# Patient Record
Sex: Male | Born: 1945
Health system: Southern US, Community
[De-identification: ages and names within clinical notes are randomized; demographics above are authoritative.]

## PROBLEM LIST (undated history)

## (undated) DIAGNOSIS — G8929 Other chronic pain: Secondary | ICD-10-CM

## (undated) DIAGNOSIS — F419 Anxiety disorder, unspecified: Secondary | ICD-10-CM

## (undated) DIAGNOSIS — C61 Malignant neoplasm of prostate: Secondary | ICD-10-CM

## (undated) DIAGNOSIS — R37 Sexual dysfunction, unspecified: Secondary | ICD-10-CM

## (undated) DIAGNOSIS — M1990 Unspecified osteoarthritis, unspecified site: Secondary | ICD-10-CM

## (undated) DIAGNOSIS — K219 Gastro-esophageal reflux disease without esophagitis: Secondary | ICD-10-CM

## (undated) DIAGNOSIS — I712 Thoracic aortic aneurysm, without rupture, unspecified: Secondary | ICD-10-CM

## (undated) DIAGNOSIS — I255 Ischemic cardiomyopathy: Secondary | ICD-10-CM

## (undated) DIAGNOSIS — J309 Allergic rhinitis, unspecified: Secondary | ICD-10-CM

## (undated) DIAGNOSIS — I2510 Atherosclerotic heart disease of native coronary artery without angina pectoris: Secondary | ICD-10-CM

## (undated) DIAGNOSIS — Z95810 Presence of automatic (implantable) cardiac defibrillator: Secondary | ICD-10-CM

## (undated) HISTORY — DX: Thoracic aortic aneurysm, without rupture, unspecified: I71.20

## (undated) HISTORY — DX: Unspecified osteoarthritis, unspecified site: M19.90

## (undated) HISTORY — DX: Malignant neoplasm of prostate: C61

## (undated) HISTORY — DX: Allergic rhinitis, unspecified: J30.9

## (undated) HISTORY — PX: CATARACT EXTRACTION: SUR2

## (undated) HISTORY — DX: Other chronic pain: G89.29

## (undated) HISTORY — DX: Presence of automatic (implantable) cardiac defibrillator: Z95.810

## (undated) HISTORY — PX: HERNIA REPAIR: SHX51

## (undated) HISTORY — DX: Gastro-esophageal reflux disease without esophagitis: K21.9

## (undated) HISTORY — DX: Sexual dysfunction, unspecified: R37

## (undated) HISTORY — DX: Thoracic aortic aneurysm, without rupture: I71.2

## (undated) HISTORY — DX: Atherosclerotic heart disease of native coronary artery without angina pectoris: I25.10

## (undated) HISTORY — PX: CHOLECYSTECTOMY: SHX55

## (undated) HISTORY — PX: IVC FILTER INSERTION: CATH118245

## (undated) HISTORY — PX: ICD IMPLANT: EP1208

## (undated) HISTORY — DX: Anxiety disorder, unspecified: F41.9

## (undated) HISTORY — DX: Ischemic cardiomyopathy: I25.5

---

## 2000-08-02 HISTORY — PX: CORONARY ARTERY BYPASS GRAFT: SHX141

## 2015-03-10 DIAGNOSIS — I429 Cardiomyopathy, unspecified: Secondary | ICD-10-CM

## 2015-03-10 DIAGNOSIS — I10 Essential (primary) hypertension: Secondary | ICD-10-CM

## 2015-03-10 HISTORY — DX: Essential (primary) hypertension: I10

## 2015-03-10 HISTORY — DX: Cardiomyopathy, unspecified: I42.9

## 2015-06-11 NOTE — Patient Outreach (Signed)
[facility]) Care Management  06/11/2015  DRACEN REIGLE 04-08-1946 [id number]   Referral from ER utilization report, assigned Quinn Plowman, RN for patient outreach.  Emillie Chasen L. Nehal Shives, [place] Care Management Assistant

## 2015-06-30 ENCOUNTER — Other Ambulatory Visit: Payer: Self-pay

## 2015-06-30 NOTE — Patient Outreach (Addendum)
[facility]) Care Management  06/30/2015  James Jenkins September 14, 1945 [id number]  SUBJECTIVE: Telephone call to patient regarding ER high risk referral.  HIPAA verified with patient. Discussed and offered [facility] Care management services to patient. Patient states he is presently receiving services from [facility] at home.  Patient states he has a nurse coming out 1 time per week. Patient states he is doing much better. Patient states he recently received his pacemaker approximately 3-4 months ago.  Patient states he had surgery on his legs.  Patient state now the swelling in his legs is much better and he has been able to walk better than ever.  Patient states he walked 3 miles today.  RNCM offered to send patient [facility] care management outreach letter and brochure for future needs. Patient verbalized agreement.  ASSESSMENT:  ER high risk referral  PLAN; RNCM will refer patient to [place] to close due to patient receiving case management services from another source. RNCM will notify patients primary MD of closure.  RNCM will send patient outreach letter and brochure as discussed.  Quinn Plowman RN,BSN,CCM [place] Coordinator [phone]

## 2017-06-20 DIAGNOSIS — I252 Old myocardial infarction: Secondary | ICD-10-CM

## 2017-06-20 DIAGNOSIS — E785 Hyperlipidemia, unspecified: Secondary | ICD-10-CM

## 2017-06-20 DIAGNOSIS — I82409 Acute embolism and thrombosis of unspecified deep veins of unspecified lower extremity: Secondary | ICD-10-CM

## 2017-06-20 HISTORY — DX: Old myocardial infarction: I25.2

## 2017-06-20 HISTORY — DX: Hyperlipidemia, unspecified: E78.5

## 2017-06-20 HISTORY — DX: Acute embolism and thrombosis of unspecified deep veins of unspecified lower extremity: I82.409

## 2017-08-09 DIAGNOSIS — Z6830 Body mass index (BMI) 30.0-30.9, adult: Secondary | ICD-10-CM | POA: Diagnosis not present

## 2017-08-09 DIAGNOSIS — Z79899 Other long term (current) drug therapy: Secondary | ICD-10-CM | POA: Diagnosis not present

## 2017-08-09 DIAGNOSIS — K219 Gastro-esophageal reflux disease without esophagitis: Secondary | ICD-10-CM | POA: Diagnosis not present

## 2017-08-09 DIAGNOSIS — Z1331 Encounter for screening for depression: Secondary | ICD-10-CM | POA: Diagnosis not present

## 2017-08-09 DIAGNOSIS — E559 Vitamin D deficiency, unspecified: Secondary | ICD-10-CM | POA: Diagnosis not present

## 2017-08-09 DIAGNOSIS — E669 Obesity, unspecified: Secondary | ICD-10-CM | POA: Diagnosis not present

## 2017-08-09 DIAGNOSIS — I2510 Atherosclerotic heart disease of native coronary artery without angina pectoris: Secondary | ICD-10-CM | POA: Diagnosis not present

## 2017-08-09 DIAGNOSIS — Z9181 History of falling: Secondary | ICD-10-CM | POA: Diagnosis not present

## 2017-08-09 DIAGNOSIS — J309 Allergic rhinitis, unspecified: Secondary | ICD-10-CM | POA: Diagnosis not present

## 2017-08-09 DIAGNOSIS — I10 Essential (primary) hypertension: Secondary | ICD-10-CM | POA: Diagnosis not present

## 2017-08-09 DIAGNOSIS — Z95810 Presence of automatic (implantable) cardiac defibrillator: Secondary | ICD-10-CM | POA: Diagnosis not present

## 2017-08-09 DIAGNOSIS — E785 Hyperlipidemia, unspecified: Secondary | ICD-10-CM | POA: Diagnosis not present

## 2017-08-30 DIAGNOSIS — Z6830 Body mass index (BMI) 30.0-30.9, adult: Secondary | ICD-10-CM | POA: Diagnosis not present

## 2017-08-30 DIAGNOSIS — G4762 Sleep related leg cramps: Secondary | ICD-10-CM | POA: Diagnosis not present

## 2017-08-30 DIAGNOSIS — J309 Allergic rhinitis, unspecified: Secondary | ICD-10-CM | POA: Diagnosis not present

## 2017-08-30 DIAGNOSIS — H68003 Unspecified Eustachian salpingitis, bilateral: Secondary | ICD-10-CM | POA: Diagnosis not present

## 2017-09-07 DIAGNOSIS — R7989 Other specified abnormal findings of blood chemistry: Secondary | ICD-10-CM | POA: Diagnosis not present

## 2017-09-07 DIAGNOSIS — R252 Cramp and spasm: Secondary | ICD-10-CM | POA: Diagnosis not present

## 2017-09-07 DIAGNOSIS — I252 Old myocardial infarction: Secondary | ICD-10-CM | POA: Diagnosis not present

## 2017-09-07 DIAGNOSIS — R110 Nausea: Secondary | ICD-10-CM | POA: Diagnosis not present

## 2017-09-07 DIAGNOSIS — I208 Other forms of angina pectoris: Secondary | ICD-10-CM | POA: Diagnosis not present

## 2017-09-07 DIAGNOSIS — I110 Hypertensive heart disease with heart failure: Secondary | ICD-10-CM | POA: Diagnosis not present

## 2017-09-07 DIAGNOSIS — R51 Headache: Secondary | ICD-10-CM | POA: Diagnosis not present

## 2017-09-07 DIAGNOSIS — R531 Weakness: Secondary | ICD-10-CM | POA: Diagnosis not present

## 2017-09-07 DIAGNOSIS — Z95810 Presence of automatic (implantable) cardiac defibrillator: Secondary | ICD-10-CM | POA: Diagnosis not present

## 2017-09-07 DIAGNOSIS — R0602 Shortness of breath: Secondary | ICD-10-CM | POA: Diagnosis not present

## 2017-09-07 DIAGNOSIS — I509 Heart failure, unspecified: Secondary | ICD-10-CM | POA: Diagnosis not present

## 2017-09-07 DIAGNOSIS — R1110 Vomiting, unspecified: Secondary | ICD-10-CM | POA: Diagnosis not present

## 2017-09-07 DIAGNOSIS — I209 Angina pectoris, unspecified: Secondary | ICD-10-CM | POA: Diagnosis not present

## 2017-09-13 DIAGNOSIS — Z79899 Other long term (current) drug therapy: Secondary | ICD-10-CM | POA: Diagnosis not present

## 2017-09-13 DIAGNOSIS — H9313 Tinnitus, bilateral: Secondary | ICD-10-CM | POA: Diagnosis not present

## 2017-09-13 DIAGNOSIS — J309 Allergic rhinitis, unspecified: Secondary | ICD-10-CM | POA: Diagnosis not present

## 2017-09-13 DIAGNOSIS — Z09 Encounter for follow-up examination after completed treatment for conditions other than malignant neoplasm: Secondary | ICD-10-CM | POA: Diagnosis not present

## 2017-09-13 DIAGNOSIS — Z6829 Body mass index (BMI) 29.0-29.9, adult: Secondary | ICD-10-CM | POA: Diagnosis not present

## 2017-09-13 DIAGNOSIS — M436 Torticollis: Secondary | ICD-10-CM | POA: Diagnosis not present

## 2017-10-13 DIAGNOSIS — Z1212 Encounter for screening for malignant neoplasm of rectum: Secondary | ICD-10-CM | POA: Diagnosis not present

## 2017-10-13 DIAGNOSIS — Z1211 Encounter for screening for malignant neoplasm of colon: Secondary | ICD-10-CM | POA: Diagnosis not present

## 2017-11-08 DIAGNOSIS — Z95810 Presence of automatic (implantable) cardiac defibrillator: Secondary | ICD-10-CM | POA: Diagnosis not present

## 2017-12-21 DIAGNOSIS — E785 Hyperlipidemia, unspecified: Secondary | ICD-10-CM | POA: Diagnosis not present

## 2017-12-21 DIAGNOSIS — I429 Cardiomyopathy, unspecified: Secondary | ICD-10-CM | POA: Diagnosis not present

## 2017-12-21 DIAGNOSIS — I2510 Atherosclerotic heart disease of native coronary artery without angina pectoris: Secondary | ICD-10-CM | POA: Diagnosis not present

## 2017-12-21 DIAGNOSIS — I252 Old myocardial infarction: Secondary | ICD-10-CM | POA: Diagnosis not present

## 2017-12-21 DIAGNOSIS — Z95810 Presence of automatic (implantable) cardiac defibrillator: Secondary | ICD-10-CM | POA: Diagnosis not present

## 2018-02-15 DIAGNOSIS — S41101A Unspecified open wound of right upper arm, initial encounter: Secondary | ICD-10-CM | POA: Diagnosis not present

## 2018-02-15 DIAGNOSIS — E663 Overweight: Secondary | ICD-10-CM | POA: Diagnosis not present

## 2018-02-15 DIAGNOSIS — Z6829 Body mass index (BMI) 29.0-29.9, adult: Secondary | ICD-10-CM | POA: Diagnosis not present

## 2018-02-15 DIAGNOSIS — Z1339 Encounter for screening examination for other mental health and behavioral disorders: Secondary | ICD-10-CM | POA: Diagnosis not present

## 2018-02-15 DIAGNOSIS — T148XXA Other injury of unspecified body region, initial encounter: Secondary | ICD-10-CM | POA: Diagnosis not present

## 2018-02-15 DIAGNOSIS — H9313 Tinnitus, bilateral: Secondary | ICD-10-CM | POA: Diagnosis not present

## 2018-02-15 DIAGNOSIS — I2510 Atherosclerotic heart disease of native coronary artery without angina pectoris: Secondary | ICD-10-CM | POA: Diagnosis not present

## 2018-02-15 DIAGNOSIS — E785 Hyperlipidemia, unspecified: Secondary | ICD-10-CM | POA: Diagnosis not present

## 2018-02-15 DIAGNOSIS — I890 Lymphedema, not elsewhere classified: Secondary | ICD-10-CM | POA: Diagnosis not present

## 2018-02-15 DIAGNOSIS — I10 Essential (primary) hypertension: Secondary | ICD-10-CM | POA: Diagnosis not present

## 2018-02-16 DIAGNOSIS — S41101A Unspecified open wound of right upper arm, initial encounter: Secondary | ICD-10-CM | POA: Diagnosis not present

## 2018-02-16 DIAGNOSIS — I2510 Atherosclerotic heart disease of native coronary artery without angina pectoris: Secondary | ICD-10-CM | POA: Diagnosis not present

## 2018-02-16 DIAGNOSIS — I890 Lymphedema, not elsewhere classified: Secondary | ICD-10-CM | POA: Diagnosis not present

## 2018-02-16 DIAGNOSIS — I10 Essential (primary) hypertension: Secondary | ICD-10-CM | POA: Diagnosis not present

## 2018-02-22 DIAGNOSIS — S41101A Unspecified open wound of right upper arm, initial encounter: Secondary | ICD-10-CM | POA: Diagnosis not present

## 2018-02-22 DIAGNOSIS — I2510 Atherosclerotic heart disease of native coronary artery without angina pectoris: Secondary | ICD-10-CM | POA: Diagnosis not present

## 2018-02-22 DIAGNOSIS — I10 Essential (primary) hypertension: Secondary | ICD-10-CM | POA: Diagnosis not present

## 2018-02-22 DIAGNOSIS — I890 Lymphedema, not elsewhere classified: Secondary | ICD-10-CM | POA: Diagnosis not present

## 2018-02-28 DIAGNOSIS — J309 Allergic rhinitis, unspecified: Secondary | ICD-10-CM | POA: Diagnosis not present

## 2018-02-28 DIAGNOSIS — E663 Overweight: Secondary | ICD-10-CM | POA: Diagnosis not present

## 2018-02-28 DIAGNOSIS — T148XXA Other injury of unspecified body region, initial encounter: Secondary | ICD-10-CM | POA: Diagnosis not present

## 2018-02-28 DIAGNOSIS — H68003 Unspecified Eustachian salpingitis, bilateral: Secondary | ICD-10-CM | POA: Diagnosis not present

## 2018-02-28 DIAGNOSIS — Z6829 Body mass index (BMI) 29.0-29.9, adult: Secondary | ICD-10-CM | POA: Diagnosis not present

## 2018-03-01 DIAGNOSIS — S41101A Unspecified open wound of right upper arm, initial encounter: Secondary | ICD-10-CM | POA: Diagnosis not present

## 2018-03-01 DIAGNOSIS — I10 Essential (primary) hypertension: Secondary | ICD-10-CM | POA: Diagnosis not present

## 2018-03-01 DIAGNOSIS — I2510 Atherosclerotic heart disease of native coronary artery without angina pectoris: Secondary | ICD-10-CM | POA: Diagnosis not present

## 2018-03-01 DIAGNOSIS — I890 Lymphedema, not elsewhere classified: Secondary | ICD-10-CM | POA: Diagnosis not present

## 2018-03-08 DIAGNOSIS — I2510 Atherosclerotic heart disease of native coronary artery without angina pectoris: Secondary | ICD-10-CM | POA: Diagnosis not present

## 2018-03-08 DIAGNOSIS — S41101A Unspecified open wound of right upper arm, initial encounter: Secondary | ICD-10-CM | POA: Diagnosis not present

## 2018-03-08 DIAGNOSIS — I890 Lymphedema, not elsewhere classified: Secondary | ICD-10-CM | POA: Diagnosis not present

## 2018-03-08 DIAGNOSIS — I10 Essential (primary) hypertension: Secondary | ICD-10-CM | POA: Diagnosis not present

## 2018-03-20 DIAGNOSIS — Z6828 Body mass index (BMI) 28.0-28.9, adult: Secondary | ICD-10-CM | POA: Diagnosis not present

## 2018-03-20 DIAGNOSIS — Z79899 Other long term (current) drug therapy: Secondary | ICD-10-CM | POA: Diagnosis not present

## 2018-03-20 DIAGNOSIS — B370 Candidal stomatitis: Secondary | ICD-10-CM | POA: Diagnosis not present

## 2018-03-20 DIAGNOSIS — E669 Obesity, unspecified: Secondary | ICD-10-CM | POA: Diagnosis not present

## 2018-03-20 DIAGNOSIS — R413 Other amnesia: Secondary | ICD-10-CM | POA: Diagnosis not present

## 2018-03-20 DIAGNOSIS — I10 Essential (primary) hypertension: Secondary | ICD-10-CM | POA: Diagnosis not present

## 2018-03-20 DIAGNOSIS — T148XXA Other injury of unspecified body region, initial encounter: Secondary | ICD-10-CM | POA: Diagnosis not present

## 2018-03-20 DIAGNOSIS — E663 Overweight: Secondary | ICD-10-CM | POA: Diagnosis not present

## 2018-03-20 DIAGNOSIS — E559 Vitamin D deficiency, unspecified: Secondary | ICD-10-CM | POA: Diagnosis not present

## 2018-03-20 DIAGNOSIS — R739 Hyperglycemia, unspecified: Secondary | ICD-10-CM | POA: Diagnosis not present

## 2018-04-19 DIAGNOSIS — Z95810 Presence of automatic (implantable) cardiac defibrillator: Secondary | ICD-10-CM | POA: Diagnosis not present

## 2018-04-27 DIAGNOSIS — Z23 Encounter for immunization: Secondary | ICD-10-CM | POA: Diagnosis not present

## 2018-05-27 DIAGNOSIS — M5489 Other dorsalgia: Secondary | ICD-10-CM | POA: Diagnosis not present

## 2018-05-27 DIAGNOSIS — N281 Cyst of kidney, acquired: Secondary | ICD-10-CM | POA: Diagnosis not present

## 2018-05-27 DIAGNOSIS — R52 Pain, unspecified: Secondary | ICD-10-CM | POA: Diagnosis not present

## 2018-05-27 DIAGNOSIS — R109 Unspecified abdominal pain: Secondary | ICD-10-CM | POA: Diagnosis not present

## 2018-05-27 DIAGNOSIS — M545 Low back pain: Secondary | ICD-10-CM | POA: Diagnosis not present

## 2018-05-27 DIAGNOSIS — K4090 Unilateral inguinal hernia, without obstruction or gangrene, not specified as recurrent: Secondary | ICD-10-CM | POA: Diagnosis not present

## 2018-06-03 DIAGNOSIS — R319 Hematuria, unspecified: Secondary | ICD-10-CM | POA: Diagnosis not present

## 2018-06-03 DIAGNOSIS — N281 Cyst of kidney, acquired: Secondary | ICD-10-CM | POA: Diagnosis not present

## 2018-06-03 DIAGNOSIS — M545 Low back pain: Secondary | ICD-10-CM | POA: Diagnosis not present

## 2018-06-13 DIAGNOSIS — R310 Gross hematuria: Secondary | ICD-10-CM | POA: Diagnosis not present

## 2018-06-13 DIAGNOSIS — R358 Other polyuria: Secondary | ICD-10-CM | POA: Diagnosis not present

## 2018-06-13 DIAGNOSIS — R339 Retention of urine, unspecified: Secondary | ICD-10-CM | POA: Diagnosis not present

## 2018-06-13 DIAGNOSIS — N138 Other obstructive and reflux uropathy: Secondary | ICD-10-CM | POA: Diagnosis not present

## 2018-06-13 DIAGNOSIS — N401 Enlarged prostate with lower urinary tract symptoms: Secondary | ICD-10-CM | POA: Diagnosis not present

## 2018-06-20 DIAGNOSIS — J209 Acute bronchitis, unspecified: Secondary | ICD-10-CM | POA: Diagnosis not present

## 2018-06-20 DIAGNOSIS — I10 Essential (primary) hypertension: Secondary | ICD-10-CM | POA: Diagnosis not present

## 2018-06-20 DIAGNOSIS — J019 Acute sinusitis, unspecified: Secondary | ICD-10-CM | POA: Diagnosis not present

## 2018-06-20 DIAGNOSIS — Z139 Encounter for screening, unspecified: Secondary | ICD-10-CM | POA: Diagnosis not present

## 2018-06-20 DIAGNOSIS — Z6828 Body mass index (BMI) 28.0-28.9, adult: Secondary | ICD-10-CM | POA: Diagnosis not present

## 2018-06-20 DIAGNOSIS — Z79899 Other long term (current) drug therapy: Secondary | ICD-10-CM | POA: Diagnosis not present

## 2018-06-20 DIAGNOSIS — E785 Hyperlipidemia, unspecified: Secondary | ICD-10-CM | POA: Diagnosis not present

## 2018-06-20 DIAGNOSIS — E559 Vitamin D deficiency, unspecified: Secondary | ICD-10-CM | POA: Diagnosis not present

## 2018-06-22 DIAGNOSIS — N401 Enlarged prostate with lower urinary tract symptoms: Secondary | ICD-10-CM | POA: Diagnosis not present

## 2018-06-22 DIAGNOSIS — N138 Other obstructive and reflux uropathy: Secondary | ICD-10-CM | POA: Diagnosis not present

## 2018-06-26 DIAGNOSIS — N401 Enlarged prostate with lower urinary tract symptoms: Secondary | ICD-10-CM | POA: Diagnosis not present

## 2018-06-26 DIAGNOSIS — E663 Overweight: Secondary | ICD-10-CM | POA: Diagnosis not present

## 2018-06-26 DIAGNOSIS — Z6828 Body mass index (BMI) 28.0-28.9, adult: Secondary | ICD-10-CM | POA: Diagnosis not present

## 2018-06-26 DIAGNOSIS — R413 Other amnesia: Secondary | ICD-10-CM | POA: Diagnosis not present

## 2018-06-26 DIAGNOSIS — J309 Allergic rhinitis, unspecified: Secondary | ICD-10-CM | POA: Diagnosis not present

## 2018-06-26 DIAGNOSIS — R05 Cough: Secondary | ICD-10-CM | POA: Diagnosis not present

## 2018-07-12 DIAGNOSIS — Z139 Encounter for screening, unspecified: Secondary | ICD-10-CM | POA: Diagnosis not present

## 2018-07-12 DIAGNOSIS — Z0000 Encounter for general adult medical examination without abnormal findings: Secondary | ICD-10-CM | POA: Diagnosis not present

## 2018-07-12 DIAGNOSIS — Z125 Encounter for screening for malignant neoplasm of prostate: Secondary | ICD-10-CM | POA: Diagnosis not present

## 2018-07-12 DIAGNOSIS — R413 Other amnesia: Secondary | ICD-10-CM | POA: Diagnosis not present

## 2018-07-12 DIAGNOSIS — Z9181 History of falling: Secondary | ICD-10-CM | POA: Diagnosis not present

## 2018-07-12 DIAGNOSIS — E785 Hyperlipidemia, unspecified: Secondary | ICD-10-CM | POA: Diagnosis not present

## 2018-07-13 DIAGNOSIS — C61 Malignant neoplasm of prostate: Secondary | ICD-10-CM | POA: Diagnosis not present

## 2018-07-13 DIAGNOSIS — R972 Elevated prostate specific antigen [PSA]: Secondary | ICD-10-CM | POA: Diagnosis not present

## 2018-07-14 DIAGNOSIS — I10 Essential (primary) hypertension: Secondary | ICD-10-CM | POA: Diagnosis not present

## 2018-07-14 DIAGNOSIS — Z6828 Body mass index (BMI) 28.0-28.9, adult: Secondary | ICD-10-CM | POA: Diagnosis not present

## 2018-07-15 DIAGNOSIS — R0689 Other abnormalities of breathing: Secondary | ICD-10-CM | POA: Diagnosis not present

## 2018-07-15 DIAGNOSIS — R079 Chest pain, unspecified: Secondary | ICD-10-CM | POA: Insufficient documentation

## 2018-07-15 DIAGNOSIS — I255 Ischemic cardiomyopathy: Secondary | ICD-10-CM | POA: Diagnosis not present

## 2018-07-15 DIAGNOSIS — I25118 Atherosclerotic heart disease of native coronary artery with other forms of angina pectoris: Secondary | ICD-10-CM | POA: Diagnosis not present

## 2018-07-15 DIAGNOSIS — R7989 Other specified abnormal findings of blood chemistry: Secondary | ICD-10-CM | POA: Diagnosis not present

## 2018-07-15 DIAGNOSIS — I420 Dilated cardiomyopathy: Secondary | ICD-10-CM | POA: Diagnosis not present

## 2018-07-15 DIAGNOSIS — Z95810 Presence of automatic (implantable) cardiac defibrillator: Secondary | ICD-10-CM | POA: Diagnosis not present

## 2018-07-15 DIAGNOSIS — I951 Orthostatic hypotension: Secondary | ICD-10-CM | POA: Diagnosis not present

## 2018-07-15 DIAGNOSIS — Z87891 Personal history of nicotine dependence: Secondary | ICD-10-CM | POA: Diagnosis not present

## 2018-07-15 DIAGNOSIS — I2510 Atherosclerotic heart disease of native coronary artery without angina pectoris: Secondary | ICD-10-CM | POA: Diagnosis not present

## 2018-07-15 DIAGNOSIS — R0902 Hypoxemia: Secondary | ICD-10-CM | POA: Diagnosis not present

## 2018-07-15 DIAGNOSIS — Z955 Presence of coronary angioplasty implant and graft: Secondary | ICD-10-CM | POA: Diagnosis not present

## 2018-07-15 DIAGNOSIS — I351 Nonrheumatic aortic (valve) insufficiency: Secondary | ICD-10-CM | POA: Diagnosis not present

## 2018-07-15 DIAGNOSIS — I110 Hypertensive heart disease with heart failure: Secondary | ICD-10-CM | POA: Diagnosis not present

## 2018-07-15 DIAGNOSIS — E7849 Other hyperlipidemia: Secondary | ICD-10-CM | POA: Diagnosis not present

## 2018-07-15 DIAGNOSIS — I499 Cardiac arrhythmia, unspecified: Secondary | ICD-10-CM | POA: Diagnosis not present

## 2018-07-15 DIAGNOSIS — E785 Hyperlipidemia, unspecified: Secondary | ICD-10-CM | POA: Diagnosis not present

## 2018-07-15 DIAGNOSIS — R42 Dizziness and giddiness: Secondary | ICD-10-CM | POA: Diagnosis not present

## 2018-07-15 DIAGNOSIS — R0602 Shortness of breath: Secondary | ICD-10-CM | POA: Diagnosis not present

## 2018-07-15 DIAGNOSIS — I252 Old myocardial infarction: Secondary | ICD-10-CM | POA: Diagnosis not present

## 2018-07-15 DIAGNOSIS — R0789 Other chest pain: Secondary | ICD-10-CM | POA: Diagnosis not present

## 2018-07-15 DIAGNOSIS — R072 Precordial pain: Secondary | ICD-10-CM | POA: Diagnosis not present

## 2018-07-15 DIAGNOSIS — I5020 Unspecified systolic (congestive) heart failure: Secondary | ICD-10-CM | POA: Diagnosis not present

## 2018-07-15 HISTORY — DX: Chest pain, unspecified: R07.9

## 2018-07-16 DIAGNOSIS — Z955 Presence of coronary angioplasty implant and graft: Secondary | ICD-10-CM | POA: Diagnosis not present

## 2018-07-16 DIAGNOSIS — R079 Chest pain, unspecified: Secondary | ICD-10-CM | POA: Diagnosis not present

## 2018-07-16 DIAGNOSIS — I255 Ischemic cardiomyopathy: Secondary | ICD-10-CM | POA: Diagnosis not present

## 2018-07-16 DIAGNOSIS — I2510 Atherosclerotic heart disease of native coronary artery without angina pectoris: Secondary | ICD-10-CM | POA: Diagnosis not present

## 2018-07-16 DIAGNOSIS — E7849 Other hyperlipidemia: Secondary | ICD-10-CM | POA: Diagnosis not present

## 2018-07-16 DIAGNOSIS — I252 Old myocardial infarction: Secondary | ICD-10-CM | POA: Diagnosis not present

## 2018-07-16 DIAGNOSIS — E785 Hyperlipidemia, unspecified: Secondary | ICD-10-CM | POA: Diagnosis not present

## 2018-07-16 DIAGNOSIS — Z95810 Presence of automatic (implantable) cardiac defibrillator: Secondary | ICD-10-CM | POA: Diagnosis not present

## 2018-07-16 DIAGNOSIS — I951 Orthostatic hypotension: Secondary | ICD-10-CM | POA: Diagnosis not present

## 2018-07-16 DIAGNOSIS — R0789 Other chest pain: Secondary | ICD-10-CM | POA: Diagnosis not present

## 2018-07-16 DIAGNOSIS — R9431 Abnormal electrocardiogram [ECG] [EKG]: Secondary | ICD-10-CM | POA: Diagnosis not present

## 2018-07-16 DIAGNOSIS — I420 Dilated cardiomyopathy: Secondary | ICD-10-CM | POA: Diagnosis not present

## 2018-07-17 DIAGNOSIS — Z09 Encounter for follow-up examination after completed treatment for conditions other than malignant neoplasm: Secondary | ICD-10-CM | POA: Diagnosis not present

## 2018-07-17 DIAGNOSIS — Z6828 Body mass index (BMI) 28.0-28.9, adult: Secondary | ICD-10-CM | POA: Diagnosis not present

## 2018-07-17 DIAGNOSIS — E559 Vitamin D deficiency, unspecified: Secondary | ICD-10-CM | POA: Diagnosis not present

## 2018-07-17 DIAGNOSIS — I2510 Atherosclerotic heart disease of native coronary artery without angina pectoris: Secondary | ICD-10-CM | POA: Diagnosis not present

## 2018-07-17 DIAGNOSIS — E785 Hyperlipidemia, unspecified: Secondary | ICD-10-CM | POA: Diagnosis not present

## 2018-07-17 DIAGNOSIS — Z79899 Other long term (current) drug therapy: Secondary | ICD-10-CM | POA: Diagnosis not present

## 2018-07-17 DIAGNOSIS — I10 Essential (primary) hypertension: Secondary | ICD-10-CM | POA: Diagnosis not present

## 2018-07-19 DIAGNOSIS — Z95810 Presence of automatic (implantable) cardiac defibrillator: Secondary | ICD-10-CM | POA: Diagnosis not present

## 2018-07-20 DIAGNOSIS — C61 Malignant neoplasm of prostate: Secondary | ICD-10-CM | POA: Diagnosis not present

## 2018-07-24 DIAGNOSIS — I429 Cardiomyopathy, unspecified: Secondary | ICD-10-CM | POA: Diagnosis not present

## 2018-07-24 DIAGNOSIS — Z95810 Presence of automatic (implantable) cardiac defibrillator: Secondary | ICD-10-CM | POA: Diagnosis not present

## 2018-08-10 DIAGNOSIS — N400 Enlarged prostate without lower urinary tract symptoms: Secondary | ICD-10-CM | POA: Diagnosis not present

## 2018-08-10 DIAGNOSIS — K769 Liver disease, unspecified: Secondary | ICD-10-CM | POA: Diagnosis not present

## 2018-08-10 DIAGNOSIS — C61 Malignant neoplasm of prostate: Secondary | ICD-10-CM | POA: Diagnosis not present

## 2018-08-17 DIAGNOSIS — E785 Hyperlipidemia, unspecified: Secondary | ICD-10-CM | POA: Diagnosis not present

## 2018-08-17 DIAGNOSIS — Z79899 Other long term (current) drug therapy: Secondary | ICD-10-CM | POA: Diagnosis not present

## 2018-08-17 DIAGNOSIS — I2510 Atherosclerotic heart disease of native coronary artery without angina pectoris: Secondary | ICD-10-CM | POA: Diagnosis not present

## 2018-08-17 DIAGNOSIS — J309 Allergic rhinitis, unspecified: Secondary | ICD-10-CM | POA: Diagnosis not present

## 2018-08-17 DIAGNOSIS — E559 Vitamin D deficiency, unspecified: Secondary | ICD-10-CM | POA: Diagnosis not present

## 2018-08-17 DIAGNOSIS — Z6828 Body mass index (BMI) 28.0-28.9, adult: Secondary | ICD-10-CM | POA: Diagnosis not present

## 2018-08-17 DIAGNOSIS — C61 Malignant neoplasm of prostate: Secondary | ICD-10-CM | POA: Diagnosis not present

## 2018-08-17 DIAGNOSIS — I10 Essential (primary) hypertension: Secondary | ICD-10-CM | POA: Diagnosis not present

## 2018-08-17 DIAGNOSIS — J209 Acute bronchitis, unspecified: Secondary | ICD-10-CM | POA: Diagnosis not present

## 2018-08-29 DIAGNOSIS — Z6828 Body mass index (BMI) 28.0-28.9, adult: Secondary | ICD-10-CM | POA: Diagnosis not present

## 2018-08-29 DIAGNOSIS — J309 Allergic rhinitis, unspecified: Secondary | ICD-10-CM | POA: Diagnosis not present

## 2018-08-29 DIAGNOSIS — C61 Malignant neoplasm of prostate: Secondary | ICD-10-CM | POA: Diagnosis not present

## 2018-09-01 DIAGNOSIS — C61 Malignant neoplasm of prostate: Secondary | ICD-10-CM | POA: Diagnosis not present

## 2018-09-06 ENCOUNTER — Encounter: Payer: Self-pay | Admitting: Cardiology

## 2018-09-06 ENCOUNTER — Telehealth: Payer: Self-pay | Admitting: *Deleted

## 2018-09-06 ENCOUNTER — Ambulatory Visit (INDEPENDENT_AMBULATORY_CARE_PROVIDER_SITE_OTHER): Payer: Medicare HMO | Admitting: Cardiology

## 2018-09-06 DIAGNOSIS — Z95810 Presence of automatic (implantable) cardiac defibrillator: Secondary | ICD-10-CM | POA: Insufficient documentation

## 2018-09-06 DIAGNOSIS — I255 Ischemic cardiomyopathy: Secondary | ICD-10-CM | POA: Diagnosis not present

## 2018-09-06 DIAGNOSIS — I2510 Atherosclerotic heart disease of native coronary artery without angina pectoris: Secondary | ICD-10-CM

## 2018-09-06 DIAGNOSIS — E785 Hyperlipidemia, unspecified: Secondary | ICD-10-CM

## 2018-09-06 DIAGNOSIS — I5022 Chronic systolic (congestive) heart failure: Secondary | ICD-10-CM | POA: Insufficient documentation

## 2018-09-06 HISTORY — DX: Hyperlipidemia, unspecified: E78.5

## 2018-09-06 HISTORY — DX: Presence of automatic (implantable) cardiac defibrillator: Z95.810

## 2018-09-06 MED ORDER — NITROGLYCERIN 0.4 MG SL SUBL: 0.4000 mg | SUBLINGUAL_TABLET | SUBLINGUAL | 1 refills | Status: AC | PRN

## 2018-09-06 NOTE — Progress Notes (Signed)
Cardiology Office Note:    Date:  09/06/2018   ID:  James Jenkins, DOB 1946-01-17, MRN [id number]  PCP:  Nicholos Johns, MD  Cardiologist:  Jenne Campus, MD    Referring MD: Nicholos Johns, MD   Chief Complaint  Patient presents with  . Coronary Artery Disease  Doing well  History of Present Illness:    James Jenkins is a 73 y.o. male is a patient of mine that I took care of previously while in hospital.  He is being followed by different group in the meantime and now he is trying to establish back as a patient in my practice.  Overall he seems to be doing well he was in the hospital a few months ago because of atypical chest pain.  His biochemical markers was unrevealing and since that time he is doing well he said he is very active he walks a lot have no difficulty doing it.  Denies have any chest pain tightness squeezing pressure burning chest.  Denies having any recent discharges from the defibrillator.  He does have some memory problem he cannot remember numbers he cannot remember his phone number.  He is taking some Aricept for it.  He cannot remember medication he is taking he forgot to take the list of his medication today with him.  No past medical history on file.    Current Medications: Current Meds  Medication Sig  . aspirin 81 MG chewable tablet Chew 1 tablet by mouth daily.  Marland Kitchen donepezil (ARICEPT) 10 MG tablet Take 10 mg by mouth daily.  . enalapril (VASOTEC) 5 MG tablet Take 1 tablet by mouth daily.  . fluticasone (FLONASE) 50 MCG/ACT nasal spray Place 2 sprays into both nostrils daily.  . furosemide (LASIX) 20 MG tablet Take 20 mg by mouth daily.  . isosorbide mononitrate (IMDUR) 30 MG 24 hr tablet Take 1 tablet by mouth daily.  . metoprolol succinate (TOPROL-XL) 25 MG 24 hr tablet Take 1 tablet by mouth daily.  . nitroGLYCERIN (NITROSTAT) 0.4 MG SL tablet Place 1 tablet under the tongue as needed for chest pain.  . Omega-3 1000 MG CAPS Take 2 g by mouth daily.  .  pantoprazole (PROTONIX) 40 MG tablet Take 1 tablet by mouth daily.  . sacubitril-valsartan (ENTRESTO) 49-51 MG Take 1 tablet by mouth 2 (two) times daily.  . simvastatin (ZOCOR) 40 MG tablet Take 1 tablet by mouth daily.  . tamsulosin (FLOMAX) 0.4 MG CAPS capsule Take 1 capsule by mouth daily.     Allergies:   Bee venom   Social History   Socioeconomic History  . Marital status: Married    Spouse name: Not on file  . Number of children: Not on file  . Years of education: Not on file  . Highest education level: Not on file  Occupational History  . Not on file  Social Needs  . Financial resource strain: Not on file  . Food insecurity:    Worry: Not on file    Inability: Not on file  . Transportation needs:    Medical: Not on file    Non-medical: Not on file  Tobacco Use  . Smoking status: Former Smoker    Types: Cigarettes  . Smokeless tobacco: Never Used  Substance and Sexual Activity  . Alcohol use: Never    Frequency: Never  . Drug use: Never  . Sexual activity: Not on file  Lifestyle  . Physical activity:    Days per week: Not on  file    Minutes per session: Not on file  . Stress: Not on file  Relationships  . Social connections:    Talks on phone: Not on file    Gets together: Not on file    Attends religious service: Not on file    Active member of club or organization: Not on file    Attends meetings of clubs or organizations: Not on file    Relationship status: Not on file  Other Topics Concern  . Not on file  Social History Narrative  . Not on file     Family History: The patient's family history includes Cancer in his brother; Diabetes in his sister; Heart attack in his sister; Heart disease in his brother and father. ROS:   Please see the history of present illness.    All 14 point review of systems negative except as described per history of present illness  EKGs/Labs/Other Studies Reviewed:      Recent Labs: No results found for requested  labs within last 8760 hours.  Recent Lipid Panel No results found for: CHOL, TRIG, HDL, CHOLHDL, VLDL, LDLCALC, LDLDIRECT  Physical Exam:    VS:  BP 124/64   Pulse 62   Ht 5\' 6"  (1.676 m)   Wt 175 lb 3.2 oz (79.5 kg)   SpO2 94%   BMI 28.28 kg/m     Wt Readings from Last 3 Encounters:  09/06/18 175 lb 3.2 oz (79.5 kg)     GEN:  Well nourished, well developed in no acute distress HEENT: Normal NECK: No JVD; No carotid bruits LYMPHATICS: No lymphadenopathy CARDIAC: RRR, no murmurs, no rubs, no gallops RESPIRATORY:  Clear to auscultation without rales, wheezing or rhonchi  ABDOMEN: Soft, non-tender, non-distended MUSCULOSKELETAL:  No edema; No deformity  SKIN: Warm and dry LOWER EXTREMITIES: no swelling NEUROLOGIC:  Alert and oriented x 3 PSYCHIATRIC:  Normal affect   ASSESSMENT:    1. Ischemic cardiomyopathy   2. Coronary artery disease involving native coronary artery of native heart without angina pectoris   3. Dyslipidemia   4. Chronic systolic (congestive) heart failure (HCC)   5. Presence of single chamber implantable cardioverter-defibrillator (ICD) Medtronic device    PLAN:    In order of problems listed above:  1. Ischemic cardiomyopathy last estimation in December echocardiogram showing 35% again I am unclear about medication he is taking on the list to have Entresto as well as Vasotec which is impossible.  Again he will call us later today or he will bring a list of medication to [place] so we can verify what medication he has been taking. 2. Coronary artery disease no recent problems on appropriate medications which I will continue. 3. Dyslipidemia I have his LDL from December 16 which is 48 which is obviously too high and unacceptably high for somebody with his problem will get list of the medication and his therapy will be augmented. 4. ICD present this is a Medtronic device I will schedule him to have appointment with our EP team so we will be able to establish him  in the clinic.  Overall he is doing well continue present management see him back in 2 months and he will call us later today with a list of medications   Medication Adjustments/Labs and Tests Ordered: Current medicines are reviewed at length with the patient today.  Concerns regarding medicines are outlined above.  No orders of the defined types were placed in this encounter.  Medication changes: No orders of  the defined types were placed in this encounter.   Signed, Park Liter, MD, [facility] 09/06/2018 9:59 AM    [place]

## 2018-09-06 NOTE — Telephone Encounter (Signed)
*  STAT* If patient is at the pharmacy, call can be transferred to refill team.   1. Which medications need to be refilled? (please list name of each medication and dose if known) Nitroglycerine  2. Which pharmacy/location (including street and city if local pharmacy) is medication to be sent to?[facility] in [place]  3. Do they need a 30 day or 90 day supply?

## 2018-09-06 NOTE — Patient Instructions (Signed)
Medication Instructions:  Your physician recommends that you continue on your current medications as directed. Please refer to the Current Medication list given to you today.  If you need a refill on your cardiac medications before your next appointment, please call your pharmacy.   Lab work: None.  If you have labs (blood work) drawn today and your tests are completely normal, you will receive your results only by: Marland Kitchen MyChart Message (if you have MyChart) OR . A paper copy in the mail If you have any lab test that is abnormal or we need to change your treatment, we will call you to review the results.  Testing/Procedures: None.  Follow-Up: At [facility], you and your health needs are our priority.  As part of our continuing mission to provide you with exceptional heart care, we have created designated Provider Care Teams.  These Care Teams include your primary Cardiologist (physician) and Advanced Practice Providers (APPs -  Physician Assistants and Nurse Practitioners) who all work together to provide you with the care you need, when you need it. You will need a follow up appointment in 2 months.  Please call our office 2 months in advance to schedule this appointment.  You may see No primary care provider on file. or another member of our [employer] Provider Team in [place]: Shirlee More, MD . Jyl Heinz, MD  Any Other Special Instructions Will Be Listed Below (If Applicable).   Dr. Agustin Cree has referred you to see Dr. Curt Bears for management of your icd.

## 2018-09-06 NOTE — Addendum Note (Signed)
Addended by: Ashok Norris on: 09/06/2018 10:05 AM   Modules accepted: Orders

## 2018-09-07 DIAGNOSIS — E785 Hyperlipidemia, unspecified: Secondary | ICD-10-CM | POA: Diagnosis not present

## 2018-09-07 DIAGNOSIS — R7303 Prediabetes: Secondary | ICD-10-CM | POA: Diagnosis not present

## 2018-09-07 DIAGNOSIS — M545 Low back pain: Secondary | ICD-10-CM | POA: Diagnosis not present

## 2018-09-07 DIAGNOSIS — I420 Dilated cardiomyopathy: Secondary | ICD-10-CM | POA: Diagnosis not present

## 2018-09-07 DIAGNOSIS — J309 Allergic rhinitis, unspecified: Secondary | ICD-10-CM | POA: Diagnosis not present

## 2018-09-07 DIAGNOSIS — I10 Essential (primary) hypertension: Secondary | ICD-10-CM | POA: Diagnosis not present

## 2018-09-07 DIAGNOSIS — R413 Other amnesia: Secondary | ICD-10-CM | POA: Diagnosis not present

## 2018-09-07 DIAGNOSIS — Z79899 Other long term (current) drug therapy: Secondary | ICD-10-CM | POA: Diagnosis not present

## 2018-09-07 DIAGNOSIS — I2510 Atherosclerotic heart disease of native coronary artery without angina pectoris: Secondary | ICD-10-CM | POA: Diagnosis not present

## 2018-09-07 DIAGNOSIS — C61 Malignant neoplasm of prostate: Secondary | ICD-10-CM | POA: Diagnosis not present

## 2018-09-07 DIAGNOSIS — K219 Gastro-esophageal reflux disease without esophagitis: Secondary | ICD-10-CM | POA: Diagnosis not present

## 2018-09-07 DIAGNOSIS — I252 Old myocardial infarction: Secondary | ICD-10-CM | POA: Diagnosis not present

## 2018-09-07 DIAGNOSIS — Z6828 Body mass index (BMI) 28.0-28.9, adult: Secondary | ICD-10-CM | POA: Diagnosis not present

## 2018-09-11 DIAGNOSIS — C61 Malignant neoplasm of prostate: Secondary | ICD-10-CM | POA: Diagnosis not present

## 2018-09-13 DIAGNOSIS — J410 Simple chronic bronchitis: Secondary | ICD-10-CM | POA: Diagnosis not present

## 2018-09-14 DIAGNOSIS — J410 Simple chronic bronchitis: Secondary | ICD-10-CM | POA: Diagnosis not present

## 2018-09-15 DIAGNOSIS — Z8679 Personal history of other diseases of the circulatory system: Secondary | ICD-10-CM | POA: Diagnosis not present

## 2018-09-15 DIAGNOSIS — R05 Cough: Secondary | ICD-10-CM | POA: Diagnosis not present

## 2018-09-15 DIAGNOSIS — R072 Precordial pain: Secondary | ICD-10-CM | POA: Diagnosis not present

## 2018-09-15 DIAGNOSIS — Z8546 Personal history of malignant neoplasm of prostate: Secondary | ICD-10-CM | POA: Diagnosis not present

## 2018-09-15 DIAGNOSIS — R062 Wheezing: Secondary | ICD-10-CM | POA: Diagnosis not present

## 2018-09-15 DIAGNOSIS — C61 Malignant neoplasm of prostate: Secondary | ICD-10-CM | POA: Diagnosis not present

## 2018-09-15 DIAGNOSIS — R079 Chest pain, unspecified: Secondary | ICD-10-CM | POA: Diagnosis not present

## 2018-09-15 DIAGNOSIS — J441 Chronic obstructive pulmonary disease with (acute) exacerbation: Secondary | ICD-10-CM | POA: Diagnosis not present

## 2018-09-15 DIAGNOSIS — R450 Nervousness: Secondary | ICD-10-CM | POA: Diagnosis not present

## 2018-09-18 DIAGNOSIS — I4949 Other premature depolarization: Secondary | ICD-10-CM | POA: Diagnosis not present

## 2018-09-18 DIAGNOSIS — I4589 Other specified conduction disorders: Secondary | ICD-10-CM | POA: Diagnosis not present

## 2018-09-18 DIAGNOSIS — I2109 ST elevation (STEMI) myocardial infarction involving other coronary artery of anterior wall: Secondary | ICD-10-CM | POA: Diagnosis not present

## 2018-09-18 DIAGNOSIS — R079 Chest pain, unspecified: Secondary | ICD-10-CM | POA: Diagnosis not present

## 2018-09-18 DIAGNOSIS — I2119 ST elevation (STEMI) myocardial infarction involving other coronary artery of inferior wall: Secondary | ICD-10-CM | POA: Diagnosis not present

## 2018-10-12 DIAGNOSIS — J410 Simple chronic bronchitis: Secondary | ICD-10-CM | POA: Diagnosis not present

## 2018-10-18 DIAGNOSIS — Z95810 Presence of automatic (implantable) cardiac defibrillator: Secondary | ICD-10-CM | POA: Diagnosis not present

## 2018-10-23 DIAGNOSIS — Z510 Encounter for antineoplastic radiation therapy: Secondary | ICD-10-CM | POA: Diagnosis not present

## 2018-10-23 DIAGNOSIS — C61 Malignant neoplasm of prostate: Secondary | ICD-10-CM | POA: Diagnosis not present

## 2018-10-25 DIAGNOSIS — C61 Malignant neoplasm of prostate: Secondary | ICD-10-CM | POA: Diagnosis not present

## 2018-10-26 DIAGNOSIS — Z510 Encounter for antineoplastic radiation therapy: Secondary | ICD-10-CM | POA: Diagnosis not present

## 2018-10-26 DIAGNOSIS — C61 Malignant neoplasm of prostate: Secondary | ICD-10-CM | POA: Diagnosis not present

## 2018-10-30 DIAGNOSIS — C61 Malignant neoplasm of prostate: Secondary | ICD-10-CM | POA: Diagnosis not present

## 2018-10-30 DIAGNOSIS — Z510 Encounter for antineoplastic radiation therapy: Secondary | ICD-10-CM | POA: Diagnosis not present

## 2018-10-31 DIAGNOSIS — C61 Malignant neoplasm of prostate: Secondary | ICD-10-CM | POA: Diagnosis not present

## 2018-10-31 DIAGNOSIS — Z510 Encounter for antineoplastic radiation therapy: Secondary | ICD-10-CM | POA: Diagnosis not present

## 2018-11-01 DIAGNOSIS — Z510 Encounter for antineoplastic radiation therapy: Secondary | ICD-10-CM | POA: Diagnosis not present

## 2018-11-01 DIAGNOSIS — C61 Malignant neoplasm of prostate: Secondary | ICD-10-CM | POA: Diagnosis not present

## 2018-11-02 ENCOUNTER — Telehealth: Payer: Self-pay | Admitting: *Deleted

## 2018-11-02 DIAGNOSIS — Z510 Encounter for antineoplastic radiation therapy: Secondary | ICD-10-CM | POA: Diagnosis not present

## 2018-11-02 DIAGNOSIS — C61 Malignant neoplasm of prostate: Secondary | ICD-10-CM | POA: Diagnosis not present

## 2018-11-02 NOTE — Telephone Encounter (Signed)
Returned patient's call.  Phone rings, goes silent , then hangs up. Will try again a little later.

## 2018-11-02 NOTE — Telephone Encounter (Signed)
Called patient to let them know due to recent [employer] and Health Department Protocols, we are not seeing patients in the office. We are instead seeing if they would like to schedule this appointment as a Research scientist (medical) or Laptop.  Patient is having difficulty hearing on the phone.  Will call back to see if calling again will help.

## 2018-11-02 NOTE — Telephone Encounter (Signed)
Please call back to schedule patient

## 2018-11-03 DIAGNOSIS — Z510 Encounter for antineoplastic radiation therapy: Secondary | ICD-10-CM | POA: Diagnosis not present

## 2018-11-03 DIAGNOSIS — C61 Malignant neoplasm of prostate: Secondary | ICD-10-CM | POA: Diagnosis not present

## 2018-11-06 DIAGNOSIS — C61 Malignant neoplasm of prostate: Secondary | ICD-10-CM | POA: Diagnosis not present

## 2018-11-06 DIAGNOSIS — Z510 Encounter for antineoplastic radiation therapy: Secondary | ICD-10-CM | POA: Diagnosis not present

## 2018-11-07 DIAGNOSIS — C61 Malignant neoplasm of prostate: Secondary | ICD-10-CM | POA: Diagnosis not present

## 2018-11-07 DIAGNOSIS — Z510 Encounter for antineoplastic radiation therapy: Secondary | ICD-10-CM | POA: Diagnosis not present

## 2018-11-07 NOTE — Telephone Encounter (Signed)
Pacemaker Data that I found though Care EveryWhere: Medtronic EVERA ICD [place], [place] [id number]  Patient does not know where his card is with his device information. He is very hard of hearing on the phone but understands with slow loud voices. He does not know any of his device transmission information and says it is not on there and he does not have it.

## 2018-11-07 NOTE — Telephone Encounter (Signed)
[place] and requested that pt be released. Operator took message and sent it the DC.

## 2018-11-08 DIAGNOSIS — Z510 Encounter for antineoplastic radiation therapy: Secondary | ICD-10-CM | POA: Diagnosis not present

## 2018-11-08 DIAGNOSIS — C61 Malignant neoplasm of prostate: Secondary | ICD-10-CM | POA: Diagnosis not present

## 2018-11-08 NOTE — Telephone Encounter (Signed)
Transfer complete. Patient will need to send manual transmission for 4/13 tele visit w/ MD.

## 2018-11-09 DIAGNOSIS — C61 Malignant neoplasm of prostate: Secondary | ICD-10-CM | POA: Diagnosis not present

## 2018-11-09 DIAGNOSIS — Z510 Encounter for antineoplastic radiation therapy: Secondary | ICD-10-CM | POA: Diagnosis not present

## 2018-11-10 ENCOUNTER — Telehealth: Payer: Self-pay | Admitting: Cardiology

## 2018-11-10 NOTE — Telephone Encounter (Signed)
Attempted to call pt x2 to request manual transmission. No answer and unable to leave a message. Last carelink transmission 10/16/2018.

## 2018-11-12 DIAGNOSIS — J410 Simple chronic bronchitis: Secondary | ICD-10-CM | POA: Diagnosis not present

## 2018-11-13 ENCOUNTER — Encounter: Payer: Self-pay | Admitting: Cardiology

## 2018-11-13 ENCOUNTER — Other Ambulatory Visit: Payer: Self-pay

## 2018-11-13 ENCOUNTER — Telehealth (INDEPENDENT_AMBULATORY_CARE_PROVIDER_SITE_OTHER): Payer: Medicare HMO | Admitting: Cardiology

## 2018-11-13 ENCOUNTER — Telehealth: Payer: Self-pay | Admitting: *Deleted

## 2018-11-13 DIAGNOSIS — I5022 Chronic systolic (congestive) heart failure: Secondary | ICD-10-CM

## 2018-11-13 DIAGNOSIS — C61 Malignant neoplasm of prostate: Secondary | ICD-10-CM | POA: Diagnosis not present

## 2018-11-13 DIAGNOSIS — Z510 Encounter for antineoplastic radiation therapy: Secondary | ICD-10-CM | POA: Diagnosis not present

## 2018-11-13 NOTE — Telephone Encounter (Signed)
Called pt and a family member answered she gave me another number to call the patient on [phone]. Attempted to call pt on that number no answer and unable to leave a message.

## 2018-11-13 NOTE — Progress Notes (Signed)
Electrophysiology TeleHealth Note   Due to national recommendations of social distancing due to COVID 19, an audio/video telehealth visit is felt to be most appropriate for this patient at this time.  Verbal consent for this visit documented in epic  Date:  11/13/2018   ID:  James Jenkins, DOB 1945-10-20, MRN [id number]  Location: patient's home  Provider location: [address] [place]  Evaluation Performed: Follow-up visit  PCP:  Nicholos Johns, MD  Cardiologist: Agustin Cree Electrophysiologist:  Dr Curt Bears  Chief Complaint: CHF  History of Present Illness:    James Jenkins is a 73 y.o. male who presents via audio/video conferencing for a telehealth visit today.  Since last being seen in our clinic, the patient reports doing very well.  Today, he denies symptoms of palpitations, chest pain, shortness of breath,  lower extremity edema, dizziness, presyncope, or syncope.  The patient is otherwise without complaint today.  The patient denies symptoms of fevers, chills, cough, or new SOB worrisome for COVID 19.  He has a history of ischemic cardiomyopathy, coronary artery disease, hyperlipidemia, and chronic systolic heart failure.  He has a single-chamber Medtronic ICD.  Today, denies symptoms of palpitations, chest pain, shortness of breath, orthopnea, PND, lower extremity edema, claudication, dizziness, presyncope, syncope, bleeding, or neurologic sequela. The patient is tolerating medications without difficulties.  He currently feels well without major complaints.  He is having no chest pain or shortness of breath.  He is able to do all of his daily activities without restriction.  Past Medical History:  Diagnosis Date  . Coronary artery disease   . ICD (implantable cardioverter-defibrillator) in place    primary prevention, medtronic  . Ischemic cardiomyopathy     Past Surgical History:  Procedure Laterality Date  . CHOLECYSTECTOMY    . HERNIA REPAIR    .  ICD IMPLANT     MDT    Current Outpatient Medications  Medication Sig Dispense Refill  . aspirin 81 MG chewable tablet Chew 1 tablet by mouth daily.    Marland Kitchen donepezil (ARICEPT) 10 MG tablet Take 10 mg by mouth daily.    . enalapril (VASOTEC) 5 MG tablet Take 1 tablet by mouth daily.    . fluticasone (FLONASE) 50 MCG/ACT nasal spray Place 2 sprays into both nostrils daily.    . furosemide (LASIX) 20 MG tablet Take 20 mg by mouth daily.    . isosorbide mononitrate (IMDUR) 30 MG 24 hr tablet Take 1 tablet by mouth daily.    . metoprolol succinate (TOPROL-XL) 25 MG 24 hr tablet Take 1 tablet by mouth daily.    . nitroGLYCERIN (NITROSTAT) 0.4 MG SL tablet Place 1 tablet (0.4 mg total) under the tongue as needed for chest pain. 25 tablet 1  . Omega-3 1000 MG CAPS Take 2 g by mouth daily.    . pantoprazole (PROTONIX) 40 MG tablet Take 1 tablet by mouth daily.    . sacubitril-valsartan (ENTRESTO) 49-51 MG Take 1 tablet by mouth 2 (two) times daily.    . simvastatin (ZOCOR) 40 MG tablet Take 1 tablet by mouth daily.    . tamsulosin (FLOMAX) 0.4 MG CAPS capsule Take 1 capsule by mouth daily.     No current facility-administered medications for this visit.     Allergies:   Bee venom   Social History:  The patient  reports that he has quit smoking. His smoking use included cigarettes. He has never used smokeless tobacco. He reports that he  does not drink alcohol or use drugs.   Family History:  The patient's  family history includes Cancer in his brother; Diabetes in his sister; Heart attack in his sister; Heart disease in his brother and father.   ROS:  Please see the history of present illness.   All other systems are personally reviewed and negative.    Exam:    Vital Signs:  Pulse 62   Wt 160 lb (72.6 kg)   BMI 25.82 kg/m   Over the phone, no acute distress, no shortness of breath   Labs/Other Tests and Data Reviewed:    Recent Labs: No results found for requested labs within last  8760 hours.   Wt Readings from Last 3 Encounters:  11/13/18 160 lb (72.6 kg)  09/06/18 175 lb 3.2 oz (79.5 kg)     Other studies personally reviewed: Additional studies/ records that were reviewed today include: TTE 07/15/2018 Summary Severely reduced LV systolic function. Ejection fraction is visually estimated at 35% There is global hypokinesis with focal hypokinesis in the anterior wall an apex. Diastolic function appears abnormal with no evidence of elevated LVEDP. Normal right ventricular size and systolic function. Mild aortic regurgitation.    ASSESSMENT & PLAN:    1.  Chronic systolic heart failure due to ischemic cardiomyopathy: Currently with a Medtronic single-chamber ICD.  Currently on optimal medical therapy with Entresto, Toprol-XL.  We will plan to set him up with remote monitoring through our clinic.  2.  Coronary artery disease: No current chest pain.  We will continue with current management.   COVID 19 screen The patient denies symptoms of COVID 19 at this time.  The importance of social distancing was discussed today.  Follow-up: 1 year  Current medicines are reviewed at length with the patient today.   The patient does not have concerns regarding his medicines.  The following changes were made today:  none  Labs/ tests ordered today include:  No orders of the defined types were placed in this encounter.    Patient Risk:  after full review of this patients clinical status, I feel that they are at moderate risk at this time.  Today, I have spent 15 minutes with the patient with telehealth technology discussing CHF.    Signed, Will Meredith Leeds, MD  11/13/2018 3:37 PM     [place] [address] [phone] (office) [phone] (fax)

## 2018-11-13 NOTE — Telephone Encounter (Signed)
Virtual Visit Pre-Appointment Phone Call  Steps For Call:  1. Confirm consent - "In the setting of the current Covid19 crisis, you are scheduled for a (phone or video) visit with your provider on (date) at (time).  Just as we do with many in-office visits, in order for you to participate in this visit, we must obtain consent.  If you'd like, I can send this to your mychart (if signed up) or email for you to review.  Otherwise, I can obtain your verbal consent now.  All virtual visits are billed to your insurance company just like a normal visit would be.  By agreeing to a virtual visit, we'd like you to understand that the technology does not allow for your provider to perform an examination, and thus may limit your provider's ability to fully assess your condition.  Finally, though the technology is pretty good, we cannot assure that it will always work on either your or our end, and in the setting of a video visit, we may have to convert it to a phone-only visit.  In either situation, we cannot ensure that we have a secure connection.  Are you willing to proceed?"  2. Give patient instructions for WebEx download to smartphone as below if video visit  3. Advise patient to be prepared with any vital sign or heart rhythm information, their current medicines, and a piece of paper and pen handy for any instructions they may receive the day of their visit  4. Inform patient they will receive a phone call 15 minutes prior to their appointment time (may be from unknown caller ID) so they should be prepared to answer  5. Confirm that appointment type is correct in Epic appointment notes (video vs telephone)    TELEPHONE CALL NOTE  James Jenkins has been deemed a candidate for a follow-up tele-health visit to limit community exposure during the Covid-19 pandemic. I spoke with the patient via phone to ensure availability of phone/video source, confirm preferred email & phone number, and discuss  instructions and expectations.  I reminded James Jenkins to be prepared with any vital sign and/or heart rhythm information that could potentially be obtained via home monitoring, at the time of his visit. I reminded James Jenkins to expect a phone call at the time of his visit if his visit.  Did the patient verbally acknowledge consent to treatment? YES  Stanton Kidney, RN 11/13/2018 8:20 AM   DOWNLOADING THE WEBEX SOFTWARE TO SMARTPHONE  - If Apple, go to [employer] and type in WebEx in the search bar. [place] [employer], the blue/green circle. The app is free but as with any other app downloads, their phone may require them to verify saved payment information or Apple password. The patient does NOT have to create an account.  - If Android, ask patient to go to [employer] and type in WebEx in the search bar. [place] [employer], the blue/green circle. The app is free but as with any other app downloads, their phone may require them to verify saved payment information or Android password. The patient does NOT have to create an account.   CONSENT FOR TELE-HEALTH VISIT - PLEASE REVIEW  I hereby voluntarily request, consent and authorize [facility] and its employed or contracted physicians, physician assistants, nurse practitioners or other licensed health care professionals (the Practitioner), to provide me with telemedicine health care services (the "Services") as deemed necessary by the treating Practitioner.  I acknowledge and consent to receive the Services by the Practitioner via telemedicine. I understand that the telemedicine visit will involve communicating with the Practitioner through live audiovisual communication technology and the disclosure of certain medical information by electronic transmission. I acknowledge that I have been given the opportunity to request an in-person assessment or other available alternative prior to the telemedicine visit and  am voluntarily participating in the telemedicine visit.  I understand that I have the right to withhold or withdraw my consent to the use of telemedicine in the course of my care at any time, without affecting my right to future care or treatment, and that the Practitioner or I may terminate the telemedicine visit at any time. I understand that I have the right to inspect all information obtained and/or recorded in the course of the telemedicine visit and may receive copies of available information for a reasonable fee.  I understand that some of the potential risks of receiving the Services via telemedicine include:  Marland Kitchen Delay or interruption in medical evaluation due to technological equipment failure or disruption; . Information transmitted may not be sufficient (e.g. poor resolution of images) to allow for appropriate medical decision making by the Practitioner; and/or  . In rare instances, security protocols could fail, causing a breach of personal health information.  Furthermore, I acknowledge that it is my responsibility to provide information about my medical history, conditions and care that is complete and accurate to the best of my ability. I acknowledge that Practitioner's advice, recommendations, and/or decision may be based on factors not within their control, such as incomplete or inaccurate data provided by me or distortions of diagnostic images or specimens that may result from electronic transmissions. I understand that the practice of medicine is not an exact science and that Practitioner makes no warranties or guarantees regarding treatment outcomes. I acknowledge that I will receive a copy of this consent concurrently upon execution via email to the email address I last provided but may also request a printed copy by calling the office of [place].    I understand that my insurance will be billed for this visit.   I have read or had this consent read to me. . I understand the  contents of this consent, which adequately explains the benefits and risks of the Services being provided via telemedicine.  . I have been provided ample opportunity to ask questions regarding this consent and the Services and have had my questions answered to my satisfaction. . I give my informed consent for the services to be provided through the use of telemedicine in my medical care  By participating in this telemedicine visit I agree to the above.

## 2018-11-14 DIAGNOSIS — C61 Malignant neoplasm of prostate: Secondary | ICD-10-CM | POA: Diagnosis not present

## 2018-11-14 DIAGNOSIS — Z510 Encounter for antineoplastic radiation therapy: Secondary | ICD-10-CM | POA: Diagnosis not present

## 2018-11-15 DIAGNOSIS — Z510 Encounter for antineoplastic radiation therapy: Secondary | ICD-10-CM | POA: Diagnosis not present

## 2018-11-15 DIAGNOSIS — C61 Malignant neoplasm of prostate: Secondary | ICD-10-CM | POA: Diagnosis not present

## 2018-11-16 DIAGNOSIS — Z510 Encounter for antineoplastic radiation therapy: Secondary | ICD-10-CM | POA: Diagnosis not present

## 2018-11-16 DIAGNOSIS — C61 Malignant neoplasm of prostate: Secondary | ICD-10-CM | POA: Diagnosis not present

## 2018-11-17 DIAGNOSIS — C61 Malignant neoplasm of prostate: Secondary | ICD-10-CM | POA: Diagnosis not present

## 2018-11-17 DIAGNOSIS — Z510 Encounter for antineoplastic radiation therapy: Secondary | ICD-10-CM | POA: Diagnosis not present

## 2018-11-20 ENCOUNTER — Telehealth: Payer: Self-pay | Admitting: Cardiology

## 2018-11-20 DIAGNOSIS — Z510 Encounter for antineoplastic radiation therapy: Secondary | ICD-10-CM | POA: Diagnosis not present

## 2018-11-20 DIAGNOSIS — C61 Malignant neoplasm of prostate: Secondary | ICD-10-CM | POA: Diagnosis not present

## 2018-11-20 NOTE — Telephone Encounter (Signed)
Virtual Visit Pre-Appointment Phone Call  Steps For Call:  1. Confirm consent - "In the setting of the current Covid19 crisis, you are scheduled for a (phone or video) visit with your provider on (date) at (time).  Just as we do with many in-office visits, in order for you to participate in this visit, we must obtain consent.  If you'd like, I can send this to your mychart (if signed up) or email for you to review.  Otherwise, I can obtain your verbal consent now.  All virtual visits are billed to your insurance company just like a normal visit would be.  By agreeing to a virtual visit, we'd like you to understand that the technology does not allow for your provider to perform an examination, and thus may limit your provider's ability to fully assess your condition. If your provider identifies any concerns that need to be evaluated in person, we will make arrangements to do so.  Finally, though the technology is pretty good, we cannot assure that it will always work on either your or our end, and in the setting of a video visit, we may have to convert it to a phone-only visit.  In either situation, we cannot ensure that we have a secure connection.  Are you willing to proceed?" STAFF: Did the patient verbally acknowledge consent to telehealth visit? Document YES/NO here: YES  2. Confirm the BEST phone number to call the day of the visit by including in appointment notes  3. Give patient instructions for WebEx/MyChart download to smartphone as below or Doximity/Doxy.me if video visit (depending on what platform provider is using)  4. Advise patient to be prepared with their blood pressure, heart rate, weight, any heart rhythm information, their current medicines, and a piece of paper and pen handy for any instructions they may receive the day of their visit  5. Inform patient they will receive a phone call 15 minutes prior to their appointment time (may be from unknown caller ID) so they should be  prepared to answer  6. Confirm that appointment type is correct in Epic appointment notes (VIDEO vs PHONE)     TELEPHONE CALL NOTE  James Jenkins has been deemed a candidate for a follow-up tele-health visit to limit community exposure during the Covid-19 pandemic. I spoke with the patient via phone to ensure availability of phone/video source, confirm preferred email & phone number, and discuss instructions and expectations.  I reminded James Jenkins to be prepared with any vital sign and/or heart rhythm information that could potentially be obtained via home monitoring, at the time of his visit. I reminded James Jenkins to expect a phone call at the time of his visit if his visit.  [name] 11/20/2018 10:55 AM   INSTRUCTIONS FOR DOWNLOADING THE WEBEX APP TO SMARTPHONE  - If Apple, ask patient to go to App Store and type in WebEx in the search bar. [employer] [employer], the blue/green circle. If Android, go to [employer] and type in [employer] in the search bar. The app is free but as with any other app downloads, their phone may require them to verify saved payment information or Apple/Android password.  - The patient does NOT have to create an account. - On the day of the visit, the assist will walk the patient through joining the meeting with the meeting number/password.  INSTRUCTIONS FOR DOWNLOADING THE MYCHART APP TO SMARTPHONE  - The patient must first make  sure to have activated MyChart and know their login information - If Apple, go to [employer] and type in MyChart in the search bar and download the app. If Android, ask patient to go to [employer] and type in [place] in the search bar and download the app. The app is free but as with any other app downloads, their phone may require them to verify saved payment information or Apple/Android password.  - The patient will need to then log into the app with their MyChart username and password, and  select [facility] as their healthcare provider to link the account. When it is time for your visit, go to the MyChart app, find appointments, and click Begin Video Visit. Be sure to Select Allow for your device to access the Microphone and Camera for your visit. You will then be connected, and your provider will be with you shortly.  **If they have any issues connecting, or need assistance please contact MyChart service desk [phone])**  **If using a computer, in order to ensure the best quality for their visit they will need to use either of the following Internet Browsers: [employer], or Google Chrome**  IF USING DOXIMITY or DOXY.ME - The patient will receive a link just prior to their visit, either by text or email (to be determined day of appointment depending on if it's doxy.me or Doximity).     FULL LENGTH CONSENT FOR TELE-HEALTH VISIT   I hereby voluntarily request, consent and authorize [facility] and its employed or contracted physicians, physician assistants, nurse practitioners or other licensed health care professionals (the Practitioner), to provide me with telemedicine health care services (the Services") as deemed necessary by the treating Practitioner. I acknowledge and consent to receive the Services by the Practitioner via telemedicine. I understand that the telemedicine visit will involve communicating with the Practitioner through live audiovisual communication technology and the disclosure of certain medical information by electronic transmission. I acknowledge that I have been given the opportunity to request an in-person assessment or other available alternative prior to the telemedicine visit and am voluntarily participating in the telemedicine visit.  I understand that I have the right to withhold or withdraw my consent to the use of telemedicine in the course of my care at any time, without affecting my right to future care or treatment, and that  the Practitioner or I may terminate the telemedicine visit at any time. I understand that I have the right to inspect all information obtained and/or recorded in the course of the telemedicine visit and may receive copies of available information for a reasonable fee.  I understand that some of the potential risks of receiving the Services via telemedicine include:   Delay or interruption in medical evaluation due to technological equipment failure or disruption;  Information transmitted may not be sufficient (e.g. poor resolution of images) to allow for appropriate medical decision making by the Practitioner; and/or   In rare instances, security protocols could fail, causing a breach of personal health information.  Furthermore, I acknowledge that it is my responsibility to provide information about my medical history, conditions and care that is complete and accurate to the best of my ability. I acknowledge that Practitioner's advice, recommendations, and/or decision may be based on factors not within their control, such as incomplete or inaccurate data provided by me or distortions of diagnostic images or specimens that may result from electronic transmissions. I understand that the practice of medicine is not an exact  science and that Practitioner makes no warranties or guarantees regarding treatment outcomes. I acknowledge that I will receive a copy of this consent concurrently upon execution via email to the email address I last provided but may also request a printed copy by calling the office of [facility].    I understand that my insurance will be billed for this visit.   I have read or had this consent read to me.  I understand the contents of this consent, which adequately explains the benefits and risks of the Services being provided via telemedicine.   I have been provided ample opportunity to ask questions regarding this consent and the Services and have had my questions answered to  my satisfaction.  I give my informed consent for the services to be provided through the use of telemedicine in my medical care  By participating in this telemedicine visit I agree to the above.

## 2018-11-21 DIAGNOSIS — Z510 Encounter for antineoplastic radiation therapy: Secondary | ICD-10-CM | POA: Diagnosis not present

## 2018-11-21 DIAGNOSIS — C61 Malignant neoplasm of prostate: Secondary | ICD-10-CM | POA: Diagnosis not present

## 2018-11-22 DIAGNOSIS — C61 Malignant neoplasm of prostate: Secondary | ICD-10-CM | POA: Diagnosis not present

## 2018-11-22 DIAGNOSIS — Z510 Encounter for antineoplastic radiation therapy: Secondary | ICD-10-CM | POA: Diagnosis not present

## 2018-11-23 DIAGNOSIS — Z510 Encounter for antineoplastic radiation therapy: Secondary | ICD-10-CM | POA: Diagnosis not present

## 2018-11-23 DIAGNOSIS — C61 Malignant neoplasm of prostate: Secondary | ICD-10-CM | POA: Diagnosis not present

## 2018-11-24 DIAGNOSIS — C61 Malignant neoplasm of prostate: Secondary | ICD-10-CM | POA: Diagnosis not present

## 2018-11-24 DIAGNOSIS — Z510 Encounter for antineoplastic radiation therapy: Secondary | ICD-10-CM | POA: Diagnosis not present

## 2018-11-27 DIAGNOSIS — Z510 Encounter for antineoplastic radiation therapy: Secondary | ICD-10-CM | POA: Diagnosis not present

## 2018-11-27 DIAGNOSIS — C61 Malignant neoplasm of prostate: Secondary | ICD-10-CM | POA: Diagnosis not present

## 2018-11-28 ENCOUNTER — Encounter: Payer: Self-pay | Admitting: Cardiology

## 2018-11-28 ENCOUNTER — Telehealth (INDEPENDENT_AMBULATORY_CARE_PROVIDER_SITE_OTHER): Payer: Medicare HMO | Admitting: Cardiology

## 2018-11-28 ENCOUNTER — Other Ambulatory Visit: Payer: Self-pay

## 2018-11-28 VITALS — Wt 160.0 lb

## 2018-11-28 DIAGNOSIS — C61 Malignant neoplasm of prostate: Secondary | ICD-10-CM | POA: Diagnosis not present

## 2018-11-28 DIAGNOSIS — E785 Hyperlipidemia, unspecified: Secondary | ICD-10-CM

## 2018-11-28 DIAGNOSIS — I255 Ischemic cardiomyopathy: Secondary | ICD-10-CM

## 2018-11-28 DIAGNOSIS — Z510 Encounter for antineoplastic radiation therapy: Secondary | ICD-10-CM | POA: Diagnosis not present

## 2018-11-28 DIAGNOSIS — I2510 Atherosclerotic heart disease of native coronary artery without angina pectoris: Secondary | ICD-10-CM

## 2018-11-28 DIAGNOSIS — Z95810 Presence of automatic (implantable) cardiac defibrillator: Secondary | ICD-10-CM

## 2018-11-28 DIAGNOSIS — I5022 Chronic systolic (congestive) heart failure: Secondary | ICD-10-CM

## 2018-11-28 NOTE — Patient Instructions (Signed)
Medication Instructions:  Your physician recommends that you continue on your current medications as directed. Please refer to the Current Medication list given to you today.  If you need a refill on your cardiac medications before your next appointment, please call your pharmacy.   Lab work: None If you have labs (blood work) drawn today and your tests are completely normal, you will receive your results only by: Marland Kitchen MyChart Message (if you have MyChart) OR . A paper copy in the mail If you have any lab test that is abnormal or we need to change your treatment, we will call you to review the results.  Testing/Procedures: None  Follow-Up: At [facility], you and your health needs are our priority.  As part of our continuing mission to provide you with exceptional heart care, we have created designated Provider Care Teams.  These Care Teams include your primary Cardiologist (physician) and Advanced Practice Providers (APPs -  Physician Assistants and Nurse Practitioners) who all work together to provide you with the care you need, when you need it. You will need a follow up appointment in 16month Any Other Special Instructions Will Be Listed Below (If Applicable).

## 2018-11-28 NOTE — Progress Notes (Signed)
Virtual Visit via Telephone Note   This visit type was conducted due to national recommendations for restrictions regarding the COVID-19 Pandemic (e.g. social distancing) in an effort to limit this patient's exposure and mitigate transmission in our community.  Due to his co-morbid illnesses, this patient is at least at moderate risk for complications without adequate follow up.  This format is felt to be most appropriate for this patient at this time.  The patient did not have access to video technology/had technical difficulties with video requiring transitioning to audio format only (telephone).  All issues noted in this document were discussed and addressed.  No physical exam could be performed with this format.  Please refer to the patient's chart for his  consent to telehealth for [facility].  Evaluation Performed:  Follow-up visit  This visit type was conducted due to national recommendations for restrictions regarding the COVID-19 Pandemic (e.g. social distancing).  This format is felt to be most appropriate for this patient at this time.  All issues noted in this document were discussed and addressed.  No physical exam was performed (except for noted visual exam findings with Video Visits).  Please refer to the patient's chart (MyChart message for video visits and phone note for telephone visits) for the patient's consent to telehealth for [facility].  Date:  11/28/2018  ID: James Jenkins, DOB 02-Jul-1946, MRN [id number]   Patient Location: [place] [id number]   Provider location:   [place] Office  PCP:  Nicholos Johns, MD  Cardiologist:  Jenne Campus, MD     Chief Complaint: Doing well  History of Present Illness:    James Jenkins is a 73 y.o. male  who presents via audio/video conferencing for a telehealth visit today.  Past medical history significant for ischemic cardiomyopathy with significantly diminished left  ventricular ejection fraction, ICD present, prostate cancer that he had chemotherapy and radiation therapy for.  We talking over the phone about his issues.  Overall he seems to be doing well from cardiac standpoint.  He is short of breath tired and tired easily but still trying to be active.  During the conversation I discover that he needs to go frequently and have his radiation therapy for prostate he he goes there 5 times a week on top of that he said that every single day he goes to [employer].  I told him with current coronavirus situation is not advisable he can go there no more than once a week.  He needs to wear masks and protective gloves.  He understand this and he will try to comply with this.  Denies having any shortness of breath does have some swelling of lower extremities interestingly worse in the morning.  Overall he seems to be doing well.   The patient does not have symptoms concerning for COVID-19 infection (fever, chills, cough, or new SHORTNESS OF BREATH).    Prior CV studies:   The following studies were reviewed today:       Past Medical History:  Diagnosis Date  . Coronary artery disease   . ICD (implantable cardioverter-defibrillator) in place    primary prevention, medtronic  . Ischemic cardiomyopathy     Past Surgical History:  Procedure Laterality Date  . CHOLECYSTECTOMY    . HERNIA REPAIR    . ICD IMPLANT     MDT     Current Meds  Medication Sig  . aspirin 81 MG chewable tablet  Chew 1 tablet by mouth daily.  Marland Kitchen donepezil (ARICEPT) 10 MG tablet Take 10 mg by mouth daily.  . enalapril (VASOTEC) 5 MG tablet Take 1 tablet by mouth daily.  . fluticasone (FLONASE) 50 MCG/ACT nasal spray Place 2 sprays into both nostrils daily.  . furosemide (LASIX) 20 MG tablet Take 20 mg by mouth daily.  . isosorbide mononitrate (IMDUR) 30 MG 24 hr tablet Take 1 tablet by mouth daily.  . metoprolol succinate (TOPROL-XL) 25 MG 24 hr tablet Take 1 tablet by mouth daily.  .  nitroGLYCERIN (NITROSTAT) 0.4 MG SL tablet Place 1 tablet (0.4 mg total) under the tongue as needed for chest pain.  . Omega-3 1000 MG CAPS Take 2 g by mouth daily.  . pantoprazole (PROTONIX) 40 MG tablet Take 1 tablet by mouth daily.  . sacubitril-valsartan (ENTRESTO) 49-51 MG Take 1 tablet by mouth 2 (two) times daily.  . simvastatin (ZOCOR) 40 MG tablet Take 1 tablet by mouth daily.  . tamsulosin (FLOMAX) 0.4 MG CAPS capsule Take 1 capsule by mouth daily.      Family History: The patient's family history includes Cancer in his brother; Diabetes in his sister; Heart attack in his sister; Heart disease in his brother and father.   ROS:   Please see the history of present illness.     All other systems reviewed and are negative.   Labs/Other Tests and Data Reviewed:     Recent Labs: No results found for requested labs within last 8760 hours.  Recent Lipid Panel No results found for: CHOL, TRIG, HDL, CHOLHDL, VLDL, LDLCALC, LDLDIRECT    Exam:    Vital Signs:  Wt 160 lb (72.6 kg)   BMI 25.82 kg/m     Wt Readings from Last 3 Encounters:  11/28/18 160 lb (72.6 kg)  11/13/18 160 lb (72.6 kg)  09/06/18 175 lb 3.2 oz (79.5 kg)     Well nourished, well developed in no acute distress. Alert awake and x3.  To be talking over the phone.  He was unable to establish video link with [place].  Diagnosis for this visit:   1. Ischemic cardiomyopathy   2. Coronary artery disease involving native coronary artery of native heart without angina pectoris   3. Chronic systolic (congestive) heart failure ([place])   4. Presence of single chamber implantable cardioverter-defibrillator (ICD) Medtronic device   5. Dyslipidemia      ASSESSMENT & PLAN:    1.  Cardiomyopathy: Appears to be compensated.  Denies having any significant shortness of breath.  Does have minimal swelling of lower extremities but worse in the morning.  Looking at his medication it looks like he takes both ACE inhibitor as  well as Delene Loll will call him and verified again make sure he does not take both.  We will continue with long-acting beta-blocker. 2.  Coronary artery disease stable from that point review denies having any chest pain. 3.  Congestive heart failure New [place] Heart Association 2.  Doing well from that point of view. 4.  Presence of ICD.  Followed by our EP team. 5.  Dyslipidemia he is taking simvastatin.  Try to get in touch with his primary care physician to see his fasting lipid profile.  His LDL need to be below 70  COVID-19 Education: The signs and symptoms of COVID-19 were discussed with the patient and how to seek care for testing (follow up with PCP or arrange E-visit).  The importance of social distancing was discussed today.  Patient Risk:   After full review of this patients clinical status, I feel that they are at least moderate risk at this time.  Time:   Today, I have spent 18 minutes with the patient with telehealth technology discussing pt health issues.  I spent 5 minutes reviewing her chart before the visit.  Visit was finished at 3:12 AM.    Medication Adjustments/Labs and Tests Ordered: Current medicines are reviewed at length with the patient today.  Concerns regarding medicines are outlined above.  No orders of the defined types were placed in this encounter.  Medication changes: No orders of the defined types were placed in this encounter.    Disposition: Follow-up 1 month.  I will contact him make sure he does not take both Entresto and ACE inhibitor.  Signed, Park Liter, MD, [facility] 11/28/2018 9:14 AM    [place]

## 2018-11-29 DIAGNOSIS — Z510 Encounter for antineoplastic radiation therapy: Secondary | ICD-10-CM | POA: Diagnosis not present

## 2018-11-29 DIAGNOSIS — C61 Malignant neoplasm of prostate: Secondary | ICD-10-CM | POA: Diagnosis not present

## 2018-11-30 DIAGNOSIS — C61 Malignant neoplasm of prostate: Secondary | ICD-10-CM | POA: Diagnosis not present

## 2018-11-30 DIAGNOSIS — Z510 Encounter for antineoplastic radiation therapy: Secondary | ICD-10-CM | POA: Diagnosis not present

## 2018-12-01 DIAGNOSIS — C61 Malignant neoplasm of prostate: Secondary | ICD-10-CM | POA: Diagnosis not present

## 2018-12-01 DIAGNOSIS — Z510 Encounter for antineoplastic radiation therapy: Secondary | ICD-10-CM | POA: Diagnosis not present

## 2018-12-04 DIAGNOSIS — Z510 Encounter for antineoplastic radiation therapy: Secondary | ICD-10-CM | POA: Diagnosis not present

## 2018-12-04 DIAGNOSIS — C61 Malignant neoplasm of prostate: Secondary | ICD-10-CM | POA: Diagnosis not present

## 2018-12-05 DIAGNOSIS — Z510 Encounter for antineoplastic radiation therapy: Secondary | ICD-10-CM | POA: Diagnosis not present

## 2018-12-05 DIAGNOSIS — C61 Malignant neoplasm of prostate: Secondary | ICD-10-CM | POA: Diagnosis not present

## 2018-12-06 DIAGNOSIS — C61 Malignant neoplasm of prostate: Secondary | ICD-10-CM | POA: Diagnosis not present

## 2018-12-06 DIAGNOSIS — Z510 Encounter for antineoplastic radiation therapy: Secondary | ICD-10-CM | POA: Diagnosis not present

## 2018-12-07 DIAGNOSIS — Z510 Encounter for antineoplastic radiation therapy: Secondary | ICD-10-CM | POA: Diagnosis not present

## 2018-12-07 DIAGNOSIS — C61 Malignant neoplasm of prostate: Secondary | ICD-10-CM | POA: Diagnosis not present

## 2018-12-08 DIAGNOSIS — Z510 Encounter for antineoplastic radiation therapy: Secondary | ICD-10-CM | POA: Diagnosis not present

## 2018-12-08 DIAGNOSIS — C61 Malignant neoplasm of prostate: Secondary | ICD-10-CM | POA: Diagnosis not present

## 2018-12-11 DIAGNOSIS — C61 Malignant neoplasm of prostate: Secondary | ICD-10-CM | POA: Diagnosis not present

## 2018-12-11 DIAGNOSIS — Z510 Encounter for antineoplastic radiation therapy: Secondary | ICD-10-CM | POA: Diagnosis not present

## 2018-12-12 DIAGNOSIS — J410 Simple chronic bronchitis: Secondary | ICD-10-CM | POA: Diagnosis not present

## 2018-12-12 DIAGNOSIS — C61 Malignant neoplasm of prostate: Secondary | ICD-10-CM | POA: Diagnosis not present

## 2018-12-12 DIAGNOSIS — Z510 Encounter for antineoplastic radiation therapy: Secondary | ICD-10-CM | POA: Diagnosis not present

## 2018-12-13 DIAGNOSIS — Z510 Encounter for antineoplastic radiation therapy: Secondary | ICD-10-CM | POA: Diagnosis not present

## 2018-12-13 DIAGNOSIS — C61 Malignant neoplasm of prostate: Secondary | ICD-10-CM | POA: Diagnosis not present

## 2018-12-14 DIAGNOSIS — C61 Malignant neoplasm of prostate: Secondary | ICD-10-CM | POA: Diagnosis not present

## 2018-12-14 DIAGNOSIS — Z510 Encounter for antineoplastic radiation therapy: Secondary | ICD-10-CM | POA: Diagnosis not present

## 2018-12-15 DIAGNOSIS — Z510 Encounter for antineoplastic radiation therapy: Secondary | ICD-10-CM | POA: Diagnosis not present

## 2018-12-15 DIAGNOSIS — C61 Malignant neoplasm of prostate: Secondary | ICD-10-CM | POA: Diagnosis not present

## 2018-12-18 DIAGNOSIS — Z510 Encounter for antineoplastic radiation therapy: Secondary | ICD-10-CM | POA: Diagnosis not present

## 2018-12-18 DIAGNOSIS — C61 Malignant neoplasm of prostate: Secondary | ICD-10-CM | POA: Diagnosis not present

## 2018-12-19 DIAGNOSIS — C61 Malignant neoplasm of prostate: Secondary | ICD-10-CM | POA: Diagnosis not present

## 2018-12-19 DIAGNOSIS — Z510 Encounter for antineoplastic radiation therapy: Secondary | ICD-10-CM | POA: Diagnosis not present

## 2018-12-20 DIAGNOSIS — C61 Malignant neoplasm of prostate: Secondary | ICD-10-CM | POA: Diagnosis not present

## 2018-12-20 DIAGNOSIS — Z510 Encounter for antineoplastic radiation therapy: Secondary | ICD-10-CM | POA: Diagnosis not present

## 2018-12-21 DIAGNOSIS — C61 Malignant neoplasm of prostate: Secondary | ICD-10-CM | POA: Diagnosis not present

## 2018-12-21 DIAGNOSIS — Z510 Encounter for antineoplastic radiation therapy: Secondary | ICD-10-CM | POA: Diagnosis not present

## 2018-12-22 DIAGNOSIS — C61 Malignant neoplasm of prostate: Secondary | ICD-10-CM | POA: Diagnosis not present

## 2018-12-22 DIAGNOSIS — Z510 Encounter for antineoplastic radiation therapy: Secondary | ICD-10-CM | POA: Diagnosis not present

## 2018-12-26 DIAGNOSIS — Z510 Encounter for antineoplastic radiation therapy: Secondary | ICD-10-CM | POA: Diagnosis not present

## 2018-12-26 DIAGNOSIS — C61 Malignant neoplasm of prostate: Secondary | ICD-10-CM | POA: Diagnosis not present

## 2019-01-01 ENCOUNTER — Telehealth: Payer: Self-pay | Admitting: Cardiology

## 2019-01-01 ENCOUNTER — Telehealth (INDEPENDENT_AMBULATORY_CARE_PROVIDER_SITE_OTHER): Payer: Medicare HMO | Admitting: Cardiology

## 2019-01-01 ENCOUNTER — Other Ambulatory Visit: Payer: Self-pay

## 2019-01-01 ENCOUNTER — Encounter: Payer: Self-pay | Admitting: Cardiology

## 2019-01-01 DIAGNOSIS — I5022 Chronic systolic (congestive) heart failure: Secondary | ICD-10-CM

## 2019-01-01 DIAGNOSIS — I2510 Atherosclerotic heart disease of native coronary artery without angina pectoris: Secondary | ICD-10-CM

## 2019-01-01 DIAGNOSIS — Z95810 Presence of automatic (implantable) cardiac defibrillator: Secondary | ICD-10-CM

## 2019-01-01 DIAGNOSIS — I255 Ischemic cardiomyopathy: Secondary | ICD-10-CM | POA: Diagnosis not present

## 2019-01-01 DIAGNOSIS — E785 Hyperlipidemia, unspecified: Secondary | ICD-10-CM

## 2019-01-01 NOTE — Progress Notes (Signed)
Virtual Visit via Telephone Note   This visit type was conducted due to national recommendations for restrictions regarding the COVID-19 Pandemic (e.g. social distancing) in an effort to limit this patient's exposure and mitigate transmission in our community.  Due to his co-morbid illnesses, this patient is at least at moderate risk for complications without adequate follow up.  This format is felt to be most appropriate for this patient at this time.  The patient did not have access to video technology/had technical difficulties with video requiring transitioning to audio format only (telephone).  All issues noted in this document were discussed and addressed.  No physical exam could be performed with this format.  Please refer to the patient's chart for his  consent to telehealth for [facility].  Evaluation Performed:  Follow-up visit  This visit type was conducted due to national recommendations for restrictions regarding the COVID-19 Pandemic (e.g. social distancing).  This format is felt to be most appropriate for this patient at this time.  All issues noted in this document were discussed and addressed.  No physical exam was performed (except for noted visual exam findings with Video Visits).  Please refer to the patient's chart (MyChart message for video visits and phone note for telephone visits) for the patient's consent to telehealth for [facility].  Date:  01/01/2019  ID: James Jenkins, DOB 11/08/1945, MRN [id number]   Patient Location: [place] [id number]   Provider location:   [place] Office  PCP:  Nicholos Johns, MD  Cardiologist:  Jenne Campus, MD     Chief Complaint: Doing better  History of Present Illness:    James Jenkins is a 73 y.o. male  who presents via audio/video conferencing for a telehealth visit today.  With ischemic cardiomyopathy, coronary artery disease, ICD.  Last time I spoke to him there was some  discrepancy in the medications.  I will have another visit make sure his medications are in order he was taking Entresto and lisinopril together of course I stressed importance of taking only Entresto and asked him to stop lisinopril the purpose of today's visit is to check make sure his medications are in order.  He said that he does not take lisinopril however he cannot find interest in his medications.  Overall he is doing better denies have any chest pain tightness squeezing pressure been chest no shortness of breath.  He finished his radiation therapy for prostate.  And overall feeling stronger   The patient does not have symptoms concerning for COVID-19 infection (fever, chills, cough, or new SHORTNESS OF BREATH).    Prior CV studies:   The following studies were reviewed today:       Past Medical History:  Diagnosis Date  . Coronary artery disease   . ICD (implantable cardioverter-defibrillator) in place    primary prevention, medtronic  . Ischemic cardiomyopathy     Past Surgical History:  Procedure Laterality Date  . CHOLECYSTECTOMY    . HERNIA REPAIR    . ICD IMPLANT     MDT     Current Meds  Medication Sig  . aspirin 81 MG chewable tablet Chew 1 tablet by mouth daily.  Marland Kitchen donepezil (ARICEPT) 10 MG tablet Take 10 mg by mouth daily.  . fluticasone (FLONASE) 50 MCG/ACT nasal spray Place 2 sprays into both nostrils daily.  . furosemide (LASIX) 20 MG tablet Take 20 mg by mouth daily.  . isosorbide mononitrate (  IMDUR) 30 MG 24 hr tablet Take 1 tablet by mouth daily.  . metoprolol succinate (TOPROL-XL) 25 MG 24 hr tablet Take 1 tablet by mouth daily.  . nitroGLYCERIN (NITROSTAT) 0.4 MG SL tablet Place 1 tablet (0.4 mg total) under the tongue as needed for chest pain.  . Omega-3 1000 MG CAPS Take 2 g by mouth daily.  . pantoprazole (PROTONIX) 40 MG tablet Take 1 tablet by mouth daily.  . sacubitril-valsartan (ENTRESTO) 49-51 MG Take 1 tablet by mouth 2 (two) times daily.   . simvastatin (ZOCOR) 40 MG tablet Take 1 tablet by mouth daily.  . tamsulosin (FLOMAX) 0.4 MG CAPS capsule Take 1 capsule by mouth daily.      Family History: The patient's family history includes Cancer in his brother; Diabetes in his sister; Heart attack in his sister; Heart disease in his brother and father.   ROS:   Please see the history of present illness.     All other systems reviewed and are negative.   Labs/Other Tests and Data Reviewed:     Recent Labs: No results found for requested labs within last 8760 hours.  Recent Lipid Panel No results found for: CHOL, TRIG, HDL, CHOLHDL, VLDL, LDLCALC, LDLDIRECT    Exam:    Vital Signs:  There were no vitals taken for this visit.    Wt Readings from Last 3 Encounters:  11/28/18 160 lb (72.6 kg)  11/13/18 160 lb (72.6 kg)  09/06/18 175 lb 3.2 oz (79.5 kg)     Well nourished, well developed in no acute distress. Alert awake oriented x3 he has no technical ability to establish video link therefore we talked over the phone.  Diagnosis for this visit:   1. Ischemic cardiomyopathy   2. Coronary artery disease involving native coronary artery of native heart without angina pectoris   3. Chronic systolic (congestive) heart failure ([place])   4. Dyslipidemia   5. Presence of single chamber implantable cardioverter-defibrillator (ICD) Medtronic device      ASSESSMENT & PLAN:    1.  Ischemic cardiomyopathy.  Again discrepancy about medications he supposed to be taking Entresto he reviewed all his medication he could not find it.  Will call pharmacy to find out exactly what medication they have on the list and then will probably ask him to simply bring medication to [place] so we can review make sure he takes both medications.  Overall at least from the description he gave me he seems to be compensated. 2.  Coronary artery disease stable denies having issues. 3.  Chronic systolic congestive heart failure I will ask him to have  repeated echocardiogram to check left ventricular ejection fraction. 4.  Dyslipidemia take statin which I will continue. 5.  ICD present followed by our EP team  COVID-19 Education: The signs and symptoms of COVID-19 were discussed with the patient and how to seek care for testing (follow up with PCP or arrange E-visit).  The importance of social distancing was discussed today.  Patient Risk:   After full review of this patients clinical status, I feel that they are at least moderate risk at this time.  Time:   Today, I have spent 16 minutes with the patient with telehealth technology discussing pt health issues.  I spent 5 minutes reviewing her chart before the visit.  Visit was finished at 8:40 AM.    Medication Adjustments/Labs and Tests Ordered: Current medicines are reviewed at length with the patient today.  Concerns regarding medicines are  outlined above.  No orders of the defined types were placed in this encounter.  Medication changes: No orders of the defined types were placed in this encounter.    Disposition: Follow-up in 3 months, echocardiogram will be scheduled  Signed, Park Liter, MD, [facility] 01/01/2019 8:41 AM    [place]

## 2019-01-01 NOTE — Telephone Encounter (Signed)
Oley Balm was supposed to call him in medicine today but he didn't

## 2019-01-01 NOTE — Patient Instructions (Signed)
Medication Instructions:  Your physician recommends that you continue on your current medications as directed. Please refer to the Current Medication list given to you today.  If you need a refill on your cardiac medications before your next appointment, please call your pharmacy.   Lab work: None.  If you have labs (blood work) drawn today and your tests are completely normal, you will receive your results only by: . MyChart Message (if you have MyChart) OR . A paper copy in the mail If you have any lab test that is abnormal or we need to change your treatment, we will call you to review the results.  Testing/Procedures: None   Follow-Up: At [facility], you and your health needs are our priority.  As part of our continuing mission to provide you with exceptional heart care, we have created designated Provider Care Teams.  These Care Teams include your primary Cardiologist (physician) and Advanced Practice Providers (APPs -  Physician Assistants and Nurse Practitioners) who all work together to provide you with the care you need, when you need it. You will need a follow up appointment in 2 months.  Please call our office 2 months in advance to schedule this appointment.  You may see No primary care provider on file. or another member of our [facility] Provider Team in [place]: Brian Munley, MD . Rajan Revankar, MD  Any Other Special Instructions Will Be Listed Below (If Applicable).    

## 2019-01-02 MED ORDER — SACUBITRIL-VALSARTAN 24-26 MG PO TABS: 1.0000 | ORAL_TABLET | Freq: Two times a day (BID) | ORAL | 1 refills | Status: DC

## 2019-01-02 NOTE — Telephone Encounter (Signed)
Called patient back after contacting pharmacy to inform of Dr. Wendy Poet recommendations. He was not in at the time I was asked to call back later.

## 2019-01-02 NOTE — Addendum Note (Signed)
Addended by: Ashok Norris on: 01/02/2019 11:14 AM   Modules accepted: Orders

## 2019-01-02 NOTE — Telephone Encounter (Signed)
Patient informed to start entresto 24/26 mg twice daily and to return in 1 week for labs. Patient verbally understands.

## 2019-01-09 DIAGNOSIS — R7303 Prediabetes: Secondary | ICD-10-CM | POA: Diagnosis not present

## 2019-01-09 DIAGNOSIS — Z6828 Body mass index (BMI) 28.0-28.9, adult: Secondary | ICD-10-CM | POA: Diagnosis not present

## 2019-01-09 DIAGNOSIS — I10 Essential (primary) hypertension: Secondary | ICD-10-CM | POA: Diagnosis not present

## 2019-01-09 DIAGNOSIS — Z79899 Other long term (current) drug therapy: Secondary | ICD-10-CM | POA: Diagnosis not present

## 2019-01-09 DIAGNOSIS — R413 Other amnesia: Secondary | ICD-10-CM | POA: Diagnosis not present

## 2019-01-09 DIAGNOSIS — I2510 Atherosclerotic heart disease of native coronary artery without angina pectoris: Secondary | ICD-10-CM | POA: Diagnosis not present

## 2019-01-09 DIAGNOSIS — E785 Hyperlipidemia, unspecified: Secondary | ICD-10-CM | POA: Diagnosis not present

## 2019-01-11 ENCOUNTER — Telehealth: Payer: Self-pay | Admitting: *Deleted

## 2019-01-11 NOTE — Telephone Encounter (Signed)
Pt went to pick up the Entresto. Cost $800 with a copay of $100. Pt cannot afford this. Please advise.

## 2019-01-11 NOTE — Telephone Encounter (Signed)
Called patient back, wife asked me to call back in a couple of hours.

## 2019-01-12 DIAGNOSIS — J410 Simple chronic bronchitis: Secondary | ICD-10-CM | POA: Diagnosis not present

## 2019-01-12 NOTE — Telephone Encounter (Signed)
Called patient back informed him of entresto patient assistance program number. I advised patient to return call if he isn't able to get assistance with this.

## 2019-01-17 ENCOUNTER — Telehealth: Payer: Self-pay | Admitting: *Deleted

## 2019-01-17 ENCOUNTER — Ambulatory Visit (INDEPENDENT_AMBULATORY_CARE_PROVIDER_SITE_OTHER): Payer: Medicare HMO | Admitting: *Deleted

## 2019-01-17 DIAGNOSIS — I255 Ischemic cardiomyopathy: Secondary | ICD-10-CM

## 2019-01-17 DIAGNOSIS — I2510 Atherosclerotic heart disease of native coronary artery without angina pectoris: Secondary | ICD-10-CM

## 2019-01-17 LAB — CUP PACEART REMOTE DEVICE CHECK
Date Time Interrogation Session: 20200617095523
Implantable Lead Implant Date: 20071009
Implantable Lead Location: 753860
Implantable Lead Model: 6949
Implantable Pulse Generator Implant Date: 20160831

## 2019-01-17 NOTE — Telephone Encounter (Signed)
Spoke with patient regarding possible fluid retention since 11/03/18 per [employer] on today's transmission. Pt reports he has some abdominal distention and BLE edema ongoing x4 weeks, but reports he thinks this is related to his recent radiation for prostate CA (final treatment ~3 weeks ago). Denies any worsened ShOB or weight gain.  Pt report he felt like the "top of his head was going to blow off" yesterday, correlates this with elevated BPs in the past, reports he took an extra 5mg  enalapril in addition to his daily 5mg  dose and symptoms resolved. He is missing the cord to his BP monitor so he was unable to confirm elevated BP. Reviewed med list, discussed Entresto. Pt is waiting on some paperwork from the [facility] patient assistance program and has not started it yet. Reports compliance with other cardiac meds.   Pt is about to take his wife to the ED for a wound issue. Advised we'll call back later if Dr. Agustin Cree wants to make any medication changes based on his fluid status. Pt verbalizes understanding and thanked me for my call.

## 2019-01-18 MED ORDER — FUROSEMIDE 20 MG PO TABS: 40.0000 mg | ORAL_TABLET | Freq: Every day | ORAL | 1 refills | Status: DC

## 2019-01-18 NOTE — Telephone Encounter (Signed)
Patient informed to increase lasix 40 mg daily and have labs rechecked on Monday. Patient verbally understands no further questions.

## 2019-01-18 NOTE — Telephone Encounter (Signed)
Increase lasix to 40 mg po qd  chem7 on Monday He should have a visit with me soon, correct?

## 2019-01-18 NOTE — Telephone Encounter (Signed)
Called patient back clarified lasix questions with patient from call today 01/18/2019

## 2019-01-18 NOTE — Telephone Encounter (Signed)
Please call pt on cell phone: [phone]

## 2019-01-18 NOTE — Telephone Encounter (Signed)
Called again with questions about Lasix

## 2019-01-18 NOTE — Telephone Encounter (Signed)
Attempted to contact patient no answer and couldn't leave voicemail will continue efforts.

## 2019-01-18 NOTE — Addendum Note (Signed)
Addended by: Ashok Norris on: 01/18/2019 03:14 PM   Modules accepted: Orders

## 2019-01-18 NOTE — Telephone Encounter (Signed)
Pt didn't understand the call he got yesterday about results. Please advise.

## 2019-01-22 ENCOUNTER — Telehealth: Payer: Self-pay | Admitting: *Deleted

## 2019-01-22 DIAGNOSIS — I2510 Atherosclerotic heart disease of native coronary artery without angina pectoris: Secondary | ICD-10-CM | POA: Diagnosis not present

## 2019-01-22 DIAGNOSIS — I255 Ischemic cardiomyopathy: Secondary | ICD-10-CM | POA: Diagnosis not present

## 2019-01-22 LAB — BASIC METABOLIC PANEL
BUN/Creatinine Ratio: 13 (ref 10–24)
BUN: 16 mg/dL (ref 8–27)
CO2: 24 mmol/L (ref 20–29)
Calcium: 10.6 mg/dL — ABNORMAL HIGH (ref 8.6–10.2)
Chloride: 103 mmol/L (ref 96–106)
Creatinine, Ser: 1.23 mg/dL (ref 0.76–1.27)
GFR calc Af Amer: 67 mL/min/{1.73_m2} (ref >59)
GFR calc non Af Amer: 58 mL/min/{1.73_m2} — ABNORMAL LOW (ref >59)
Glucose: 81 mg/dL (ref 65–99)
Potassium: 4.4 mmol/L (ref 3.5–5.2)
Sodium: 140 mmol/L (ref 134–144)

## 2019-01-22 NOTE — Telephone Encounter (Signed)
Pt called to get number for financial assistance with Entrest. Number [phone]

## 2019-01-23 DIAGNOSIS — C61 Malignant neoplasm of prostate: Secondary | ICD-10-CM | POA: Diagnosis not present

## 2019-01-28 ENCOUNTER — Encounter: Payer: Self-pay | Admitting: Cardiology

## 2019-01-28 NOTE — Progress Notes (Signed)
Remote ICD transmission.   

## 2019-01-29 ENCOUNTER — Telehealth: Payer: Self-pay | Admitting: *Deleted

## 2019-01-29 NOTE — Telephone Encounter (Signed)
I was able to reach the pt today and went over device results per Dr. Curt Bears. Advised pt per Dr. Curt Bears we need to increase his lasix to 40 mg BID x 5 days then go back to 40 mg daily. Pt read back directions to me x 2 with verbal understanding. Pt said he does not need a refill at this time.

## 2019-01-29 NOTE — Telephone Encounter (Signed)
-----   Message from Will Meredith Leeds, MD sent at 01/17/2019 11:07 AM EDT ----- Abnormal device interrogation reviewed.  Lead parameters and battery status stable.  Fluid levels increased. Increase lasix to 40 mg daily for 5 days.

## 2019-02-01 DIAGNOSIS — C61 Malignant neoplasm of prostate: Secondary | ICD-10-CM | POA: Diagnosis not present

## 2019-02-07 DIAGNOSIS — C61 Malignant neoplasm of prostate: Secondary | ICD-10-CM | POA: Diagnosis not present

## 2019-02-11 DIAGNOSIS — J410 Simple chronic bronchitis: Secondary | ICD-10-CM | POA: Diagnosis not present

## 2019-02-19 ENCOUNTER — Telehealth: Payer: Self-pay | Admitting: Cardiology

## 2019-02-19 MED ORDER — ENTRESTO 24-26 MG PO TABS: 1.0000 | ORAL_TABLET | Freq: Two times a day (BID) | ORAL | 1 refills | Status: DC

## 2019-02-19 NOTE — Telephone Encounter (Signed)
Patient went to Dr Raliegh Ip and he stopped his Enalapril and the Delene Loll is too expensive and WILL NOT Take it, can we suggest something else and call to the [place].Marland Kitchen

## 2019-02-19 NOTE — Addendum Note (Signed)
Addended by: Ashok Norris on: 02/19/2019 03:27 PM   Modules accepted: Orders

## 2019-02-19 NOTE — Telephone Encounter (Signed)
Call entresto to [place]

## 2019-02-19 NOTE — Telephone Encounter (Signed)
Entresto 24/26 mg twice daily refilled.

## 2019-02-20 MED ORDER — ENALAPRIL MALEATE 5 MG PO TABS: 5.0000 mg | ORAL_TABLET | Freq: Every day | ORAL | 1 refills | Status: DC

## 2019-02-20 NOTE — Telephone Encounter (Signed)
Confirmed patient is no longer taking entresto.. Advised patient to restart enalapril 5 mg daily per Dr. Agustin Cree, patient verbally understands.

## 2019-02-20 NOTE — Addendum Note (Signed)
Addended by: Ashok Norris on: 02/20/2019 02:11 PM   Modules accepted: Orders

## 2019-02-20 NOTE — Telephone Encounter (Signed)
Ok, lets go back to enalapril, previous dose

## 2019-03-29 DIAGNOSIS — Z6828 Body mass index (BMI) 28.0-28.9, adult: Secondary | ICD-10-CM | POA: Diagnosis not present

## 2019-03-29 DIAGNOSIS — J309 Allergic rhinitis, unspecified: Secondary | ICD-10-CM | POA: Diagnosis not present

## 2019-03-29 DIAGNOSIS — R785 Finding of other psychotropic drug in blood: Secondary | ICD-10-CM | POA: Diagnosis not present

## 2019-03-29 DIAGNOSIS — R7303 Prediabetes: Secondary | ICD-10-CM | POA: Diagnosis not present

## 2019-03-29 DIAGNOSIS — M545 Low back pain: Secondary | ICD-10-CM | POA: Diagnosis not present

## 2019-03-29 DIAGNOSIS — Z79899 Other long term (current) drug therapy: Secondary | ICD-10-CM | POA: Diagnosis not present

## 2019-03-29 DIAGNOSIS — I10 Essential (primary) hypertension: Secondary | ICD-10-CM | POA: Diagnosis not present

## 2019-03-29 DIAGNOSIS — R413 Other amnesia: Secondary | ICD-10-CM | POA: Diagnosis not present

## 2019-03-29 DIAGNOSIS — I2510 Atherosclerotic heart disease of native coronary artery without angina pectoris: Secondary | ICD-10-CM | POA: Diagnosis not present

## 2019-03-30 DIAGNOSIS — R7303 Prediabetes: Secondary | ICD-10-CM | POA: Diagnosis not present

## 2019-03-30 DIAGNOSIS — Z79899 Other long term (current) drug therapy: Secondary | ICD-10-CM | POA: Diagnosis not present

## 2019-03-30 DIAGNOSIS — I10 Essential (primary) hypertension: Secondary | ICD-10-CM | POA: Diagnosis not present

## 2019-03-30 DIAGNOSIS — E785 Hyperlipidemia, unspecified: Secondary | ICD-10-CM | POA: Diagnosis not present

## 2019-03-30 DIAGNOSIS — E559 Vitamin D deficiency, unspecified: Secondary | ICD-10-CM | POA: Diagnosis not present

## 2019-04-05 ENCOUNTER — Telehealth: Payer: Medicare HMO | Admitting: Cardiology

## 2019-04-12 ENCOUNTER — Telehealth: Payer: Self-pay | Admitting: Cardiology

## 2019-04-12 NOTE — Telephone Encounter (Signed)
error 

## 2019-04-18 ENCOUNTER — Ambulatory Visit (INDEPENDENT_AMBULATORY_CARE_PROVIDER_SITE_OTHER): Payer: Medicare HMO | Admitting: *Deleted

## 2019-04-18 ENCOUNTER — Telehealth: Payer: Medicare HMO | Admitting: Cardiology

## 2019-04-18 DIAGNOSIS — I255 Ischemic cardiomyopathy: Secondary | ICD-10-CM

## 2019-04-18 LAB — CUP PACEART REMOTE DEVICE CHECK
Battery Remaining Longevity: 102 mo
Battery Voltage: 3.01 V
Brady Statistic RV Percent Paced: 0.05 %
Date Time Interrogation Session: 20200916073525
HighPow Impedance: 42 Ohm
HighPow Impedance: 54 Ohm
Implantable Lead Implant Date: 20071009
Implantable Lead Location: 753860
Implantable Lead Model: 6949
Implantable Pulse Generator Implant Date: 20160831
Lead Channel Impedance Value: 266 Ohm
Lead Channel Impedance Value: 380 Ohm
Lead Channel Pacing Threshold Amplitude: 1.375 V
Lead Channel Pacing Threshold Pulse Width: 0.4 ms
Lead Channel Sensing Intrinsic Amplitude: 7.625 mV
Lead Channel Sensing Intrinsic Amplitude: 7.625 mV
Lead Channel Setting Pacing Amplitude: 2.75 V
Lead Channel Setting Pacing Pulse Width: 0.4 ms
Lead Channel Setting Sensing Sensitivity: 0.3 mV

## 2019-04-24 ENCOUNTER — Encounter: Payer: Self-pay | Admitting: Cardiology

## 2019-04-24 NOTE — Progress Notes (Signed)
Remote ICD transmission.   

## 2019-05-23 DIAGNOSIS — M79604 Pain in right leg: Secondary | ICD-10-CM | POA: Diagnosis not present

## 2019-05-23 DIAGNOSIS — K4090 Unilateral inguinal hernia, without obstruction or gangrene, not specified as recurrent: Secondary | ICD-10-CM | POA: Diagnosis not present

## 2019-05-23 DIAGNOSIS — R1031 Right lower quadrant pain: Secondary | ICD-10-CM | POA: Diagnosis not present

## 2019-05-23 DIAGNOSIS — Z9049 Acquired absence of other specified parts of digestive tract: Secondary | ICD-10-CM | POA: Diagnosis not present

## 2019-05-23 DIAGNOSIS — K5790 Diverticulosis of intestine, part unspecified, without perforation or abscess without bleeding: Secondary | ICD-10-CM | POA: Diagnosis not present

## 2019-05-23 DIAGNOSIS — N289 Disorder of kidney and ureter, unspecified: Secondary | ICD-10-CM | POA: Diagnosis not present

## 2019-05-23 DIAGNOSIS — K5730 Diverticulosis of large intestine without perforation or abscess without bleeding: Secondary | ICD-10-CM | POA: Diagnosis not present

## 2019-05-23 DIAGNOSIS — M549 Dorsalgia, unspecified: Secondary | ICD-10-CM | POA: Diagnosis not present

## 2019-06-12 DIAGNOSIS — R1110 Vomiting, unspecified: Secondary | ICD-10-CM | POA: Diagnosis not present

## 2019-06-12 DIAGNOSIS — K449 Diaphragmatic hernia without obstruction or gangrene: Secondary | ICD-10-CM | POA: Diagnosis not present

## 2019-06-12 DIAGNOSIS — R739 Hyperglycemia, unspecified: Secondary | ICD-10-CM | POA: Insufficient documentation

## 2019-06-12 DIAGNOSIS — R55 Syncope and collapse: Secondary | ICD-10-CM | POA: Diagnosis not present

## 2019-06-12 DIAGNOSIS — R7989 Other specified abnormal findings of blood chemistry: Secondary | ICD-10-CM | POA: Diagnosis not present

## 2019-06-12 DIAGNOSIS — R1111 Vomiting without nausea: Secondary | ICD-10-CM | POA: Diagnosis not present

## 2019-06-12 DIAGNOSIS — N182 Chronic kidney disease, stage 2 (mild): Secondary | ICD-10-CM

## 2019-06-12 DIAGNOSIS — K5732 Diverticulitis of large intestine without perforation or abscess without bleeding: Secondary | ICD-10-CM

## 2019-06-12 DIAGNOSIS — D696 Thrombocytopenia, unspecified: Secondary | ICD-10-CM

## 2019-06-12 DIAGNOSIS — N400 Enlarged prostate without lower urinary tract symptoms: Secondary | ICD-10-CM | POA: Diagnosis not present

## 2019-06-12 DIAGNOSIS — R197 Diarrhea, unspecified: Secondary | ICD-10-CM | POA: Diagnosis not present

## 2019-06-12 DIAGNOSIS — N179 Acute kidney failure, unspecified: Secondary | ICD-10-CM | POA: Diagnosis not present

## 2019-06-12 DIAGNOSIS — R112 Nausea with vomiting, unspecified: Secondary | ICD-10-CM | POA: Diagnosis not present

## 2019-06-12 DIAGNOSIS — R61 Generalized hyperhidrosis: Secondary | ICD-10-CM | POA: Diagnosis not present

## 2019-06-12 DIAGNOSIS — I082 Rheumatic disorders of both aortic and tricuspid valves: Secondary | ICD-10-CM | POA: Diagnosis not present

## 2019-06-12 DIAGNOSIS — R1032 Left lower quadrant pain: Secondary | ICD-10-CM | POA: Diagnosis not present

## 2019-06-12 DIAGNOSIS — D649 Anemia, unspecified: Secondary | ICD-10-CM

## 2019-06-12 DIAGNOSIS — K5792 Diverticulitis of intestine, part unspecified, without perforation or abscess without bleeding: Secondary | ICD-10-CM | POA: Diagnosis not present

## 2019-06-12 DIAGNOSIS — R001 Bradycardia, unspecified: Secondary | ICD-10-CM | POA: Diagnosis not present

## 2019-06-12 DIAGNOSIS — K4020 Bilateral inguinal hernia, without obstruction or gangrene, not specified as recurrent: Secondary | ICD-10-CM | POA: Diagnosis not present

## 2019-06-12 DIAGNOSIS — I5022 Chronic systolic (congestive) heart failure: Secondary | ICD-10-CM | POA: Diagnosis not present

## 2019-06-12 DIAGNOSIS — R0902 Hypoxemia: Secondary | ICD-10-CM | POA: Diagnosis not present

## 2019-06-12 DIAGNOSIS — I700 Atherosclerosis of aorta: Secondary | ICD-10-CM | POA: Diagnosis not present

## 2019-06-12 HISTORY — DX: Anemia, unspecified: D64.9

## 2019-06-12 HISTORY — DX: Chronic kidney disease, stage 2 (mild): N18.2

## 2019-06-12 HISTORY — DX: Diverticulitis of large intestine without perforation or abscess without bleeding: K57.32

## 2019-06-12 HISTORY — DX: Hyperglycemia, unspecified: R73.9

## 2019-06-12 HISTORY — DX: Thrombocytopenia, unspecified: D69.6

## 2019-06-13 DIAGNOSIS — N179 Acute kidney failure, unspecified: Secondary | ICD-10-CM | POA: Diagnosis not present

## 2019-06-13 DIAGNOSIS — D649 Anemia, unspecified: Secondary | ICD-10-CM | POA: Diagnosis not present

## 2019-06-13 DIAGNOSIS — K5732 Diverticulitis of large intestine without perforation or abscess without bleeding: Secondary | ICD-10-CM | POA: Diagnosis not present

## 2019-06-13 DIAGNOSIS — I5022 Chronic systolic (congestive) heart failure: Secondary | ICD-10-CM | POA: Diagnosis not present

## 2019-06-13 DIAGNOSIS — R001 Bradycardia, unspecified: Secondary | ICD-10-CM | POA: Diagnosis not present

## 2019-06-13 DIAGNOSIS — I340 Nonrheumatic mitral (valve) insufficiency: Secondary | ICD-10-CM | POA: Diagnosis not present

## 2019-06-13 DIAGNOSIS — D696 Thrombocytopenia, unspecified: Secondary | ICD-10-CM | POA: Diagnosis not present

## 2019-06-14 ENCOUNTER — Ambulatory Visit: Payer: Self-pay | Admitting: Cardiology

## 2019-06-14 DIAGNOSIS — D649 Anemia, unspecified: Secondary | ICD-10-CM | POA: Diagnosis not present

## 2019-06-14 DIAGNOSIS — N179 Acute kidney failure, unspecified: Secondary | ICD-10-CM | POA: Diagnosis not present

## 2019-06-14 DIAGNOSIS — I5022 Chronic systolic (congestive) heart failure: Secondary | ICD-10-CM | POA: Diagnosis not present

## 2019-06-14 DIAGNOSIS — D696 Thrombocytopenia, unspecified: Secondary | ICD-10-CM | POA: Diagnosis not present

## 2019-06-14 DIAGNOSIS — K5732 Diverticulitis of large intestine without perforation or abscess without bleeding: Secondary | ICD-10-CM | POA: Diagnosis not present

## 2019-06-25 DIAGNOSIS — Z09 Encounter for follow-up examination after completed treatment for conditions other than malignant neoplasm: Secondary | ICD-10-CM | POA: Diagnosis not present

## 2019-06-25 DIAGNOSIS — K5732 Diverticulitis of large intestine without perforation or abscess without bleeding: Secondary | ICD-10-CM | POA: Diagnosis not present

## 2019-06-25 DIAGNOSIS — R413 Other amnesia: Secondary | ICD-10-CM | POA: Diagnosis not present

## 2019-06-25 DIAGNOSIS — K4090 Unilateral inguinal hernia, without obstruction or gangrene, not specified as recurrent: Secondary | ICD-10-CM | POA: Diagnosis not present

## 2019-06-25 DIAGNOSIS — J309 Allergic rhinitis, unspecified: Secondary | ICD-10-CM | POA: Diagnosis not present

## 2019-06-26 DIAGNOSIS — J309 Allergic rhinitis, unspecified: Secondary | ICD-10-CM | POA: Diagnosis not present

## 2019-07-02 ENCOUNTER — Telehealth: Payer: Self-pay | Admitting: Cardiology

## 2019-07-02 MED ORDER — ENALAPRIL MALEATE 5 MG PO TABS: 5.0000 mg | ORAL_TABLET | Freq: Every day | ORAL | 0 refills | Status: DC

## 2019-07-02 NOTE — Telephone Encounter (Signed)
Call enalipril to [facility]

## 2019-07-02 NOTE — Addendum Note (Signed)
Addended by: Ashok Norris on: 07/02/2019 12:25 PM   Modules accepted: Orders

## 2019-07-02 NOTE — Telephone Encounter (Signed)
Enalipril refilled.

## 2019-07-09 ENCOUNTER — Ambulatory Visit: Payer: Self-pay | Admitting: Cardiology

## 2019-07-17 DIAGNOSIS — E785 Hyperlipidemia, unspecified: Secondary | ICD-10-CM | POA: Diagnosis not present

## 2019-07-17 DIAGNOSIS — Z0000 Encounter for general adult medical examination without abnormal findings: Secondary | ICD-10-CM | POA: Diagnosis not present

## 2019-07-17 DIAGNOSIS — Z125 Encounter for screening for malignant neoplasm of prostate: Secondary | ICD-10-CM | POA: Diagnosis not present

## 2019-07-17 DIAGNOSIS — Z9181 History of falling: Secondary | ICD-10-CM | POA: Diagnosis not present

## 2019-07-17 DIAGNOSIS — Z139 Encounter for screening, unspecified: Secondary | ICD-10-CM | POA: Diagnosis not present

## 2019-07-17 DIAGNOSIS — Z1331 Encounter for screening for depression: Secondary | ICD-10-CM | POA: Diagnosis not present

## 2019-07-18 ENCOUNTER — Ambulatory Visit (INDEPENDENT_AMBULATORY_CARE_PROVIDER_SITE_OTHER): Payer: Medicare HMO | Admitting: *Deleted

## 2019-07-18 DIAGNOSIS — I255 Ischemic cardiomyopathy: Secondary | ICD-10-CM | POA: Diagnosis not present

## 2019-07-18 LAB — CUP PACEART REMOTE DEVICE CHECK
Battery Remaining Longevity: 98 mo
Battery Voltage: 3.01 V
Brady Statistic RV Percent Paced: 0.11 %
Date Time Interrogation Session: 20201216001705
HighPow Impedance: 51 Ohm
HighPow Impedance: 65 Ohm
Implantable Lead Implant Date: 20071009
Implantable Lead Location: 753860
Implantable Lead Model: 6949
Implantable Pulse Generator Implant Date: 20160831
Lead Channel Impedance Value: 380 Ohm
Lead Channel Impedance Value: 456 Ohm
Lead Channel Pacing Threshold Amplitude: 1 V
Lead Channel Pacing Threshold Pulse Width: 0.4 ms
Lead Channel Sensing Intrinsic Amplitude: 7 mV
Lead Channel Sensing Intrinsic Amplitude: 7 mV
Lead Channel Setting Pacing Amplitude: 2.25 V
Lead Channel Setting Pacing Pulse Width: 0.4 ms
Lead Channel Setting Sensing Sensitivity: 0.3 mV

## 2019-07-19 ENCOUNTER — Ambulatory Visit (INDEPENDENT_AMBULATORY_CARE_PROVIDER_SITE_OTHER): Payer: Medicare HMO | Admitting: Cardiology

## 2019-07-19 ENCOUNTER — Encounter: Payer: Self-pay | Admitting: Cardiology

## 2019-07-19 ENCOUNTER — Other Ambulatory Visit: Payer: Self-pay

## 2019-07-19 VITALS — BP 110/60 | HR 56 | Ht 66.0 in | Wt 169.0 lb

## 2019-07-19 DIAGNOSIS — Z95810 Presence of automatic (implantable) cardiac defibrillator: Secondary | ICD-10-CM

## 2019-07-19 DIAGNOSIS — E785 Hyperlipidemia, unspecified: Secondary | ICD-10-CM | POA: Diagnosis not present

## 2019-07-19 DIAGNOSIS — I255 Ischemic cardiomyopathy: Secondary | ICD-10-CM | POA: Diagnosis not present

## 2019-07-19 DIAGNOSIS — R519 Headache, unspecified: Secondary | ICD-10-CM

## 2019-07-19 DIAGNOSIS — I2510 Atherosclerotic heart disease of native coronary artery without angina pectoris: Secondary | ICD-10-CM | POA: Diagnosis not present

## 2019-07-19 DIAGNOSIS — S0990XD Unspecified injury of head, subsequent encounter: Secondary | ICD-10-CM | POA: Diagnosis not present

## 2019-07-19 NOTE — Addendum Note (Signed)
Addended by: Ashok Norris on: 07/19/2019 10:08 AM   Modules accepted: Orders

## 2019-07-19 NOTE — Patient Instructions (Signed)
Medication Instructions:  Your physician recommends that you continue on your current medications as directed. Please refer to the Current Medication list given to you today.  *If you need a refill on your cardiac medications before your next appointment, please call your pharmacy*  Lab Work: None.  If you have labs (blood work) drawn today and your tests are completely normal, you will receive your results only by: Marland Kitchen MyChart Message (if you have MyChart) OR . A paper copy in the mail If you have any lab test that is abnormal or we need to change your treatment, we will call you to review the results.  Testing/Procedures: Non-Cardiac CT scanning, (CAT scanning), is a noninvasive, special x-ray that produces cross-sectional images of the body using x-rays and a computer. CT scans help physicians diagnose and treat medical conditions. For some CT exams, a contrast material is used to enhance visibility in the area of the body being studied. CT scans provide greater clarity and reveal more details than regular x-ray exams.    Follow-Up: At [facility], you and your health needs are our priority.  As part of our continuing mission to provide you with exceptional heart care, we have created designated Provider Care Teams.  These Care Teams include your primary Cardiologist (physician) and Advanced Practice Providers (APPs -  Physician Assistants and Nurse Practitioners) who all work together to provide you with the care you need, when you need it.  Your next appointment:   3 month(s)  The format for your next appointment:   In Person  Provider:   Jenne Campus, MD  Other Instructions   CT Scan  A CT scan (computed tomography scan) is an imaging scan. It uses X-rays and a computer to make detailed pictures of different areas inside the body. A CT scan can give more information than a regular X-ray exam. A CT scan provides data about internal organs, soft tissue structures, blood  vessels, and bones. In this procedure, the pictures will be taken in a large machine that has an opening (CT scanner). Tell a health care provider about:  Any allergies you have.  All medicines you are taking, including vitamins, herbs, eye drops, creams, and over-the-counter medicines.  Any blood disorders you have.  Any surgeries you have had.  Any medical conditions you have.  Whether you are pregnant or may be pregnant. What are the risks? Generally, this is a safe procedure. However, problems may occur, including:  An allergic reaction to dyes.  Development of cancer from excessive exposure to radiation from multiple CT scans. This is rare. What happens before the procedure? Staying hydrated Follow instructions from your health care provider about hydration, which may include:  Up to 2 hours before the procedure - you may continue to drink clear liquids, such as water, clear fruit juice, black coffee, and plain tea. Eating and drinking restrictions Follow instructions from your health care provider about eating and drinking, which may include:  24 hours before the procedure - stop drinking caffeinated beverages, such as energy drinks, tea, soda, coffee, and hot chocolate.  8 hours before the procedure - stop eating heavy meals or foods such as meat, fried foods, or fatty foods.  6 hours before the procedure - stop eating light meals or foods, such as toast or cereal.  6 hours before the procedure - stop drinking milk or drinks that contain milk.  2 hours before the procedure - stop drinking clear liquids. General instructions  Remove any jewelry.  Ask your health care provider about changing or stopping your regular medicines. This is especially important if you are taking diabetes medicines or blood thinners. What happens during the procedure?  You will lie on a table with your arms above your head.  An IV tube may be inserted into one of your veins.  The  contrast dye may be injected into the IV tube. You may feel warm or have a metallic taste in your mouth.  The table you will be lying on will move into the CT scanner.  You will be able to see, hear, and talk to the person running the machine while you are in it. Follow that person's instructions.  The CT scanner will move around you to take pictures. Do not move while it is scanning. Staying still helps the scanner to get a good image.  When the best possible pictures have been taken, the machine will be turned off. The table will be moved out of the machine.  The IV tube will be removed. The procedure may vary among health care providers and hospitals. What happens after the procedure?  It is up to you to get the results of your procedure. Ask your health care provider, or the department that is doing the procedure, when your results will be ready. Summary  A CT scan is an imaging scan.  A CT scan uses X-rays and a computer to make detailed pictures of different areas of your body.  Follow instructions from your health care provider about eating and drinking before the procedure.  You will be able to see, hear, and talk to the person running the machine while you are in it. Follow that person's instructions. This information is not intended to replace advice given to you by your health care provider. Make sure you discuss any questions you have with your health care provider. Document Released: 08/26/2004 Document Revised: 08/21/2016 Document Reviewed: 08/21/2016 Elsevier Patient Education  2020 [employer].

## 2019-07-19 NOTE — Progress Notes (Signed)
Cardiology Office Note:    Date:  07/19/2019   ID:  James Jenkins, DOB Sep 17, 1945, MRN [id number]  PCP:  Nicholos Johns, MD  Cardiologist:  Jenne Campus, MD    Referring MD: Nicholos Johns, MD   Chief Complaint  Patient presents with  . Follow-up  Doing better  History of Present Illness:    James Jenkins is a 73 y.o. male complex past medical history and overall because of his is some degree of dementia still is always very complex and difficult to gain out of him.  He described the fact that few weeks ago he had bloody diarrhea he ended up falling down and striking his head since that time has been complaining of having headache.  It looks like he end up going to [facility].  Quite extensive evaluation has been done which include echocardiogram showing ejection fraction 40 to 45%.  And again seems to worsen likely phase before the some discrepancy about his medication he tells me he still take Delene Loll but it is not on the list of the medication he hit that we have.  Again we will try to contact pharmacy and find out exactly what is the medication he is taking he complained of having headache every single day he said it happens since the time of his fall.  Past Medical History:  Diagnosis Date  . Coronary artery disease   . ICD (implantable cardioverter-defibrillator) in place    primary prevention, medtronic  . Ischemic cardiomyopathy     Past Surgical History:  Procedure Laterality Date  . CHOLECYSTECTOMY    . HERNIA REPAIR    . ICD IMPLANT     MDT    Current Medications: Current Meds  Medication Sig  . aspirin 81 MG chewable tablet Chew 1 tablet by mouth daily.  Marland Kitchen donepezil (ARICEPT) 10 MG tablet Take 10 mg by mouth daily.  . enalapril (VASOTEC) 5 MG tablet Take 1 tablet (5 mg total) by mouth daily.  . fluticasone (FLONASE) 50 MCG/ACT nasal spray Place 2 sprays into both nostrils daily.  . furosemide (LASIX) 20 MG tablet Take 2 tablets (40 mg  total) by mouth daily.  . isosorbide mononitrate (IMDUR) 30 MG 24 hr tablet Take 1 tablet by mouth daily.  . metoprolol succinate (TOPROL-XL) 25 MG 24 hr tablet Take 1 tablet by mouth daily.  . nitroGLYCERIN (NITROSTAT) 0.4 MG SL tablet Place 1 tablet (0.4 mg total) under the tongue as needed for chest pain.  . Omega-3 1000 MG CAPS Take 2 g by mouth daily.  . pantoprazole (PROTONIX) 40 MG tablet Take 1 tablet by mouth daily.  . simvastatin (ZOCOR) 40 MG tablet Take 1 tablet by mouth daily.  . tamsulosin (FLOMAX) 0.4 MG CAPS capsule Take 1 capsule by mouth daily.     Allergies:   Bee venom   Social History   Socioeconomic History  . Marital status: Married    Spouse name: Not on file  . Number of children: Not on file  . Years of education: Not on file  . Highest education level: Not on file  Occupational History  . Not on file  Tobacco Use  . Smoking status: Former Smoker    Types: Cigarettes  . Smokeless tobacco: Never Used  Substance and Sexual Activity  . Alcohol use: Never  . Drug use: Never  . Sexual activity: Not on file  Other Topics Concern  . Not on file  Social History Narrative  .  Not on file   Social Determinants of Health   Financial Resource Strain:   . Difficulty of Paying Living Expenses: Not on file  Food Insecurity:   . Worried About Charity fundraiser in the Last Year: Not on file  . Ran Out of Food in the Last Year: Not on file  Transportation Needs:   . Lack of Transportation (Medical): Not on file  . Lack of Transportation (Non-Medical): Not on file  Physical Activity:   . Days of Exercise per Week: Not on file  . Minutes of Exercise per Session: Not on file  Stress:   . Feeling of Stress : Not on file  Social Connections:   . Frequency of Communication with Friends and Family: Not on file  . Frequency of Social Gatherings with Friends and Family: Not on file  . Attends Religious Services: Not on file  . Active Member of Clubs or  Organizations: Not on file  . Attends Archivist Meetings: Not on file  . Marital Status: Not on file     Family History: The patient's family history includes Cancer in his brother; Diabetes in his sister; Heart attack in his sister; Heart disease in his brother and father. ROS:   Please see the history of present illness.    All 14 point review of systems negative except as described per history of present illness  EKGs/Labs/Other Studies Reviewed:      Recent Labs: 01/22/2019: BUN 16; Creatinine, Ser 1.23; Potassium 4.4; Sodium 140  Recent Lipid Panel No results found for: CHOL, TRIG, HDL, CHOLHDL, VLDL, LDLCALC, LDLDIRECT  Physical Exam:    VS:  BP 110/60   Pulse (!) 56   Ht 5\' 6"  (1.676 m)   Wt 169 lb (76.7 kg)   SpO2 97%   BMI 27.28 kg/m     Wt Readings from Last 3 Encounters:  07/19/19 169 lb (76.7 kg)  11/28/18 160 lb (72.6 kg)  11/13/18 160 lb (72.6 kg)     GEN:  Well nourished, well developed in no acute distress HEENT: Normal NECK: No JVD; No carotid bruits LYMPHATICS: No lymphadenopathy CARDIAC: RRR, no murmurs, no rubs, no gallops RESPIRATORY:  Clear to auscultation without rales, wheezing or rhonchi  ABDOMEN: Soft, non-tender, non-distended MUSCULOSKELETAL:  No edema; No deformity  SKIN: Warm and dry LOWER EXTREMITIES: no swelling NEUROLOGIC:  Alert and oriented x 3 PSYCHIATRIC:  Normal affect   ASSESSMENT:    1. Ischemic cardiomyopathy   2. Coronary artery disease involving native coronary artery of native heart without angina pectoris   3. Dyslipidemia   4. Presence of single chamber implantable cardioverter-defibrillator (ICD) Medtronic device    PLAN:    In order of problems listed above:  1. Ischemic cardiomyopathy ejection fraction 4045%.  Will call pharmacy to find out what exactly what medication he is on. 2. Coronary disease denies having any issue recently. 3. Dyslipidemia I do not see cholesterol medication on the list of  his medication but again I suspect list is incomplete.  Will contact his primary care physician the pharmacy find out exactly what medication he is on 4. ICD present followed by our EP team stable doing well from that point of view 5. Headaches since the time of her fall a couple weeks ago.  We will schedule him to have CT of his head.   Medication Adjustments/Labs and Tests Ordered: Current medicines are reviewed at length with the patient today.  Concerns regarding medicines are outlined  above.  No orders of the defined types were placed in this encounter.  Medication changes: No orders of the defined types were placed in this encounter.   Signed, Park Liter, MD, [facility] 07/19/2019 9:53 AM    [place]

## 2019-07-25 ENCOUNTER — Ambulatory Visit (HOSPITAL_BASED_OUTPATIENT_CLINIC_OR_DEPARTMENT_OTHER): Admission: RE | Admit: 2019-07-25 | Payer: Medicare HMO | Source: Ambulatory Visit

## 2019-07-25 ENCOUNTER — Other Ambulatory Visit: Payer: Self-pay

## 2019-07-25 ENCOUNTER — Ambulatory Visit (HOSPITAL_BASED_OUTPATIENT_CLINIC_OR_DEPARTMENT_OTHER)
Admission: RE | Admit: 2019-07-25 | Discharge: 2019-07-25 | Disposition: A | Discharge status: Home / Self Care | Payer: Medicare HMO | Source: Ambulatory Visit | Attending: Cardiology | Admitting: Cardiology

## 2019-07-25 DIAGNOSIS — S0990XD Unspecified injury of head, subsequent encounter: Secondary | ICD-10-CM | POA: Diagnosis not present

## 2019-07-25 DIAGNOSIS — R519 Headache, unspecified: Secondary | ICD-10-CM | POA: Insufficient documentation

## 2019-07-25 DIAGNOSIS — S0990XA Unspecified injury of head, initial encounter: Secondary | ICD-10-CM | POA: Diagnosis not present

## 2019-07-30 DIAGNOSIS — I429 Cardiomyopathy, unspecified: Secondary | ICD-10-CM | POA: Diagnosis not present

## 2019-07-30 DIAGNOSIS — R0989 Other specified symptoms and signs involving the circulatory and respiratory systems: Secondary | ICD-10-CM | POA: Diagnosis not present

## 2019-07-30 DIAGNOSIS — R072 Precordial pain: Secondary | ICD-10-CM | POA: Diagnosis not present

## 2019-07-30 DIAGNOSIS — Z95810 Presence of automatic (implantable) cardiac defibrillator: Secondary | ICD-10-CM | POA: Diagnosis not present

## 2019-07-30 DIAGNOSIS — I5032 Chronic diastolic (congestive) heart failure: Secondary | ICD-10-CM | POA: Diagnosis not present

## 2019-07-30 DIAGNOSIS — R4182 Altered mental status, unspecified: Secondary | ICD-10-CM | POA: Diagnosis not present

## 2019-07-30 DIAGNOSIS — I5022 Chronic systolic (congestive) heart failure: Secondary | ICD-10-CM | POA: Diagnosis not present

## 2019-07-30 DIAGNOSIS — R42 Dizziness and giddiness: Secondary | ICD-10-CM | POA: Diagnosis not present

## 2019-07-30 DIAGNOSIS — R410 Disorientation, unspecified: Secondary | ICD-10-CM | POA: Diagnosis not present

## 2019-07-30 DIAGNOSIS — R079 Chest pain, unspecified: Secondary | ICD-10-CM | POA: Diagnosis not present

## 2019-07-30 DIAGNOSIS — I110 Hypertensive heart disease with heart failure: Secondary | ICD-10-CM | POA: Diagnosis not present

## 2019-07-30 DIAGNOSIS — I951 Orthostatic hypotension: Secondary | ICD-10-CM | POA: Diagnosis not present

## 2019-07-30 DIAGNOSIS — I700 Atherosclerosis of aorta: Secondary | ICD-10-CM | POA: Diagnosis not present

## 2019-07-30 DIAGNOSIS — I2510 Atherosclerotic heart disease of native coronary artery without angina pectoris: Secondary | ICD-10-CM | POA: Diagnosis not present

## 2019-07-30 DIAGNOSIS — I255 Ischemic cardiomyopathy: Secondary | ICD-10-CM | POA: Diagnosis not present

## 2019-07-30 DIAGNOSIS — E785 Hyperlipidemia, unspecified: Secondary | ICD-10-CM | POA: Diagnosis not present

## 2019-07-30 DIAGNOSIS — D696 Thrombocytopenia, unspecified: Secondary | ICD-10-CM | POA: Diagnosis not present

## 2019-07-30 DIAGNOSIS — I252 Old myocardial infarction: Secondary | ICD-10-CM | POA: Diagnosis not present

## 2019-07-30 DIAGNOSIS — R531 Weakness: Secondary | ICD-10-CM | POA: Diagnosis not present

## 2019-07-31 DIAGNOSIS — I255 Ischemic cardiomyopathy: Secondary | ICD-10-CM | POA: Diagnosis not present

## 2019-07-31 DIAGNOSIS — E785 Hyperlipidemia, unspecified: Secondary | ICD-10-CM | POA: Diagnosis not present

## 2019-07-31 DIAGNOSIS — I429 Cardiomyopathy, unspecified: Secondary | ICD-10-CM | POA: Diagnosis not present

## 2019-07-31 DIAGNOSIS — I700 Atherosclerosis of aorta: Secondary | ICD-10-CM | POA: Diagnosis not present

## 2019-07-31 DIAGNOSIS — I5022 Chronic systolic (congestive) heart failure: Secondary | ICD-10-CM | POA: Diagnosis not present

## 2019-07-31 DIAGNOSIS — I951 Orthostatic hypotension: Secondary | ICD-10-CM | POA: Diagnosis not present

## 2019-07-31 DIAGNOSIS — R42 Dizziness and giddiness: Secondary | ICD-10-CM | POA: Diagnosis not present

## 2019-08-08 DIAGNOSIS — K4090 Unilateral inguinal hernia, without obstruction or gangrene, not specified as recurrent: Secondary | ICD-10-CM | POA: Diagnosis not present

## 2019-08-08 DIAGNOSIS — I5022 Chronic systolic (congestive) heart failure: Secondary | ICD-10-CM | POA: Diagnosis not present

## 2019-08-08 DIAGNOSIS — Z95810 Presence of automatic (implantable) cardiac defibrillator: Secondary | ICD-10-CM | POA: Diagnosis not present

## 2019-08-08 DIAGNOSIS — I252 Old myocardial infarction: Secondary | ICD-10-CM | POA: Diagnosis not present

## 2019-08-08 NOTE — Progress Notes (Signed)
ICD remote 

## 2019-09-07 DIAGNOSIS — K5900 Constipation, unspecified: Secondary | ICD-10-CM | POA: Diagnosis not present

## 2019-09-07 DIAGNOSIS — R109 Unspecified abdominal pain: Secondary | ICD-10-CM | POA: Diagnosis not present

## 2019-09-07 DIAGNOSIS — Z1211 Encounter for screening for malignant neoplasm of colon: Secondary | ICD-10-CM | POA: Diagnosis not present

## 2019-10-02 DIAGNOSIS — R413 Other amnesia: Secondary | ICD-10-CM | POA: Diagnosis not present

## 2019-10-02 DIAGNOSIS — I10 Essential (primary) hypertension: Secondary | ICD-10-CM | POA: Diagnosis not present

## 2019-10-02 DIAGNOSIS — I2510 Atherosclerotic heart disease of native coronary artery without angina pectoris: Secondary | ICD-10-CM | POA: Diagnosis not present

## 2019-10-02 DIAGNOSIS — K219 Gastro-esophageal reflux disease without esophagitis: Secondary | ICD-10-CM | POA: Diagnosis not present

## 2019-10-02 DIAGNOSIS — K4090 Unilateral inguinal hernia, without obstruction or gangrene, not specified as recurrent: Secondary | ICD-10-CM | POA: Diagnosis not present

## 2019-10-04 DIAGNOSIS — N5082 Scrotal pain: Secondary | ICD-10-CM | POA: Diagnosis not present

## 2019-10-04 DIAGNOSIS — F29 Unspecified psychosis not due to a substance or known physiological condition: Secondary | ICD-10-CM | POA: Diagnosis not present

## 2019-10-04 DIAGNOSIS — R45851 Suicidal ideations: Secondary | ICD-10-CM | POA: Diagnosis not present

## 2019-10-04 DIAGNOSIS — K5900 Constipation, unspecified: Secondary | ICD-10-CM | POA: Diagnosis not present

## 2019-10-04 DIAGNOSIS — F338 Other recurrent depressive disorders: Secondary | ICD-10-CM | POA: Diagnosis not present

## 2019-10-04 DIAGNOSIS — N433 Hydrocele, unspecified: Secondary | ICD-10-CM | POA: Diagnosis not present

## 2019-10-04 DIAGNOSIS — R112 Nausea with vomiting, unspecified: Secondary | ICD-10-CM | POA: Diagnosis not present

## 2019-10-04 DIAGNOSIS — Z634 Disappearance and death of family member: Secondary | ICD-10-CM | POA: Diagnosis not present

## 2019-10-04 DIAGNOSIS — F0390 Unspecified dementia without behavioral disturbance: Secondary | ICD-10-CM

## 2019-10-04 DIAGNOSIS — Z20822 Contact with and (suspected) exposure to covid-19: Secondary | ICD-10-CM | POA: Diagnosis not present

## 2019-10-04 DIAGNOSIS — I700 Atherosclerosis of aorta: Secondary | ICD-10-CM | POA: Diagnosis not present

## 2019-10-04 DIAGNOSIS — F332 Major depressive disorder, recurrent severe without psychotic features: Secondary | ICD-10-CM | POA: Insufficient documentation

## 2019-10-04 DIAGNOSIS — F0150 Vascular dementia without behavioral disturbance: Secondary | ICD-10-CM | POA: Insufficient documentation

## 2019-10-04 DIAGNOSIS — F4321 Adjustment disorder with depressed mood: Secondary | ICD-10-CM | POA: Diagnosis not present

## 2019-10-04 DIAGNOSIS — R1111 Vomiting without nausea: Secondary | ICD-10-CM | POA: Diagnosis not present

## 2019-10-04 DIAGNOSIS — Z79899 Other long term (current) drug therapy: Secondary | ICD-10-CM | POA: Diagnosis not present

## 2019-10-04 DIAGNOSIS — R4189 Other symptoms and signs involving cognitive functions and awareness: Secondary | ICD-10-CM | POA: Diagnosis not present

## 2019-10-04 HISTORY — DX: Unspecified dementia, unspecified severity, without behavioral disturbance, psychotic disturbance, mood disturbance, and anxiety: F03.90

## 2019-10-04 HISTORY — DX: Major depressive disorder, recurrent severe without psychotic features: F33.2

## 2019-10-05 DIAGNOSIS — Z20822 Contact with and (suspected) exposure to covid-19: Secondary | ICD-10-CM | POA: Diagnosis not present

## 2019-10-05 DIAGNOSIS — N5082 Scrotal pain: Secondary | ICD-10-CM | POA: Diagnosis not present

## 2019-10-05 DIAGNOSIS — F4321 Adjustment disorder with depressed mood: Secondary | ICD-10-CM | POA: Diagnosis not present

## 2019-10-05 DIAGNOSIS — F332 Major depressive disorder, recurrent severe without psychotic features: Secondary | ICD-10-CM | POA: Diagnosis not present

## 2019-10-05 DIAGNOSIS — I499 Cardiac arrhythmia, unspecified: Secondary | ICD-10-CM | POA: Diagnosis not present

## 2019-10-05 DIAGNOSIS — N433 Hydrocele, unspecified: Secondary | ICD-10-CM | POA: Diagnosis not present

## 2019-10-05 DIAGNOSIS — R4189 Other symptoms and signs involving cognitive functions and awareness: Secondary | ICD-10-CM | POA: Diagnosis not present

## 2019-10-10 DIAGNOSIS — Z79899 Other long term (current) drug therapy: Secondary | ICD-10-CM | POA: Diagnosis not present

## 2019-10-10 DIAGNOSIS — E785 Hyperlipidemia, unspecified: Secondary | ICD-10-CM | POA: Diagnosis not present

## 2019-10-10 DIAGNOSIS — F4321 Adjustment disorder with depressed mood: Secondary | ICD-10-CM | POA: Diagnosis not present

## 2019-10-10 DIAGNOSIS — F418 Other specified anxiety disorders: Secondary | ICD-10-CM | POA: Diagnosis not present

## 2019-10-10 DIAGNOSIS — J309 Allergic rhinitis, unspecified: Secondary | ICD-10-CM | POA: Diagnosis not present

## 2019-10-17 ENCOUNTER — Ambulatory Visit (INDEPENDENT_AMBULATORY_CARE_PROVIDER_SITE_OTHER): Payer: Medicare HMO | Admitting: *Deleted

## 2019-10-17 DIAGNOSIS — I255 Ischemic cardiomyopathy: Secondary | ICD-10-CM | POA: Diagnosis not present

## 2019-10-17 LAB — CUP PACEART REMOTE DEVICE CHECK
Battery Remaining Longevity: 97 mo
Battery Voltage: 3.01 V
Brady Statistic RV Percent Paced: 0.02 %
Date Time Interrogation Session: 20210317033324
HighPow Impedance: 47 Ohm
HighPow Impedance: 60 Ohm
Implantable Lead Implant Date: 20071009
Implantable Lead Location: 753860
Implantable Lead Model: 6949
Implantable Pulse Generator Implant Date: 20160831
Lead Channel Impedance Value: 285 Ohm
Lead Channel Impedance Value: 399 Ohm
Lead Channel Pacing Threshold Amplitude: 1.5 V
Lead Channel Pacing Threshold Pulse Width: 0.4 ms
Lead Channel Sensing Intrinsic Amplitude: 6.625 mV
Lead Channel Sensing Intrinsic Amplitude: 6.625 mV
Lead Channel Setting Pacing Amplitude: 3 V
Lead Channel Setting Pacing Pulse Width: 0.4 ms
Lead Channel Setting Sensing Sensitivity: 0.3 mV

## 2019-10-17 NOTE — Progress Notes (Signed)
ICD Remote  

## 2019-10-20 DIAGNOSIS — F332 Major depressive disorder, recurrent severe without psychotic features: Secondary | ICD-10-CM | POA: Diagnosis not present

## 2019-10-20 DIAGNOSIS — M25552 Pain in left hip: Secondary | ICD-10-CM | POA: Diagnosis not present

## 2019-10-20 DIAGNOSIS — Z20822 Contact with and (suspected) exposure to covid-19: Secondary | ICD-10-CM | POA: Diagnosis not present

## 2019-10-20 DIAGNOSIS — R5381 Other malaise: Secondary | ICD-10-CM | POA: Diagnosis not present

## 2019-10-20 DIAGNOSIS — R52 Pain, unspecified: Secondary | ICD-10-CM | POA: Diagnosis not present

## 2019-10-20 DIAGNOSIS — R45851 Suicidal ideations: Secondary | ICD-10-CM | POA: Diagnosis not present

## 2019-10-20 DIAGNOSIS — R419 Unspecified symptoms and signs involving cognitive functions and awareness: Secondary | ICD-10-CM | POA: Diagnosis not present

## 2019-10-20 DIAGNOSIS — M79604 Pain in right leg: Secondary | ICD-10-CM | POA: Diagnosis not present

## 2019-10-21 DIAGNOSIS — F32A Depression, unspecified: Secondary | ICD-10-CM | POA: Insufficient documentation

## 2019-10-21 DIAGNOSIS — Z20822 Contact with and (suspected) exposure to covid-19: Secondary | ICD-10-CM | POA: Diagnosis not present

## 2019-10-21 DIAGNOSIS — M25552 Pain in left hip: Secondary | ICD-10-CM | POA: Diagnosis not present

## 2019-10-21 DIAGNOSIS — F4321 Adjustment disorder with depressed mood: Secondary | ICD-10-CM | POA: Diagnosis not present

## 2019-10-21 DIAGNOSIS — E785 Hyperlipidemia, unspecified: Secondary | ICD-10-CM | POA: Diagnosis not present

## 2019-10-21 DIAGNOSIS — F332 Major depressive disorder, recurrent severe without psychotic features: Secondary | ICD-10-CM | POA: Diagnosis not present

## 2019-10-21 DIAGNOSIS — N400 Enlarged prostate without lower urinary tract symptoms: Secondary | ICD-10-CM | POA: Diagnosis not present

## 2019-10-21 DIAGNOSIS — F329 Major depressive disorder, single episode, unspecified: Secondary | ICD-10-CM | POA: Insufficient documentation

## 2019-10-21 DIAGNOSIS — R419 Unspecified symptoms and signs involving cognitive functions and awareness: Secondary | ICD-10-CM | POA: Diagnosis not present

## 2019-10-21 DIAGNOSIS — F322 Major depressive disorder, single episode, severe without psychotic features: Secondary | ICD-10-CM | POA: Diagnosis not present

## 2019-10-21 DIAGNOSIS — R45851 Suicidal ideations: Secondary | ICD-10-CM | POA: Diagnosis not present

## 2019-10-21 DIAGNOSIS — I110 Hypertensive heart disease with heart failure: Secondary | ICD-10-CM | POA: Diagnosis not present

## 2019-10-21 DIAGNOSIS — I2510 Atherosclerotic heart disease of native coronary artery without angina pectoris: Secondary | ICD-10-CM | POA: Diagnosis not present

## 2019-10-21 DIAGNOSIS — I10 Essential (primary) hypertension: Secondary | ICD-10-CM | POA: Diagnosis not present

## 2019-10-21 DIAGNOSIS — I509 Heart failure, unspecified: Secondary | ICD-10-CM | POA: Diagnosis not present

## 2019-10-21 HISTORY — DX: Depression, unspecified: F32.A

## 2019-10-22 DIAGNOSIS — N400 Enlarged prostate without lower urinary tract symptoms: Secondary | ICD-10-CM | POA: Insufficient documentation

## 2019-10-22 DIAGNOSIS — F329 Major depressive disorder, single episode, unspecified: Secondary | ICD-10-CM | POA: Insufficient documentation

## 2019-10-22 HISTORY — DX: Benign prostatic hyperplasia without lower urinary tract symptoms: N40.0

## 2019-10-22 HISTORY — DX: Major depressive disorder, single episode, unspecified: F32.9

## 2019-10-22 MED ORDER — SERTRALINE HCL 25 MG PO TABS
25.00 | ORAL_TABLET | ORAL | Status: DC
Start: 2019-10-22 — End: 2019-10-22

## 2019-10-30 ENCOUNTER — Ambulatory Visit: Payer: Medicare HMO | Admitting: Cardiology

## 2019-10-30 DIAGNOSIS — I509 Heart failure, unspecified: Secondary | ICD-10-CM | POA: Insufficient documentation

## 2019-10-30 HISTORY — DX: Heart failure, unspecified: I50.9

## 2019-11-06 ENCOUNTER — Ambulatory Visit: Payer: Medicare HMO | Admitting: Cardiology

## 2019-11-07 DIAGNOSIS — Z09 Encounter for follow-up examination after completed treatment for conditions other than malignant neoplasm: Secondary | ICD-10-CM | POA: Diagnosis not present

## 2019-11-07 DIAGNOSIS — F418 Other specified anxiety disorders: Secondary | ICD-10-CM | POA: Diagnosis not present

## 2019-11-07 DIAGNOSIS — Z79899 Other long term (current) drug therapy: Secondary | ICD-10-CM | POA: Diagnosis not present

## 2019-11-07 DIAGNOSIS — N433 Hydrocele, unspecified: Secondary | ICD-10-CM | POA: Diagnosis not present

## 2019-11-07 DIAGNOSIS — F4321 Adjustment disorder with depressed mood: Secondary | ICD-10-CM | POA: Diagnosis not present

## 2019-11-13 DIAGNOSIS — Z01812 Encounter for preprocedural laboratory examination: Secondary | ICD-10-CM | POA: Diagnosis not present

## 2019-11-13 DIAGNOSIS — Z20822 Contact with and (suspected) exposure to covid-19: Secondary | ICD-10-CM | POA: Diagnosis not present

## 2019-11-19 DIAGNOSIS — M545 Low back pain: Secondary | ICD-10-CM | POA: Diagnosis not present

## 2019-12-04 DIAGNOSIS — C61 Malignant neoplasm of prostate: Secondary | ICD-10-CM | POA: Diagnosis not present

## 2019-12-05 DIAGNOSIS — T8859XA Other complications of anesthesia, initial encounter: Secondary | ICD-10-CM | POA: Insufficient documentation

## 2019-12-06 DIAGNOSIS — R5381 Other malaise: Secondary | ICD-10-CM | POA: Diagnosis not present

## 2019-12-06 DIAGNOSIS — R410 Disorientation, unspecified: Secondary | ICD-10-CM | POA: Diagnosis not present

## 2019-12-06 DIAGNOSIS — R110 Nausea: Secondary | ICD-10-CM | POA: Diagnosis not present

## 2019-12-06 DIAGNOSIS — J9811 Atelectasis: Secondary | ICD-10-CM | POA: Diagnosis not present

## 2019-12-06 DIAGNOSIS — Z79899 Other long term (current) drug therapy: Secondary | ICD-10-CM | POA: Diagnosis not present

## 2019-12-06 DIAGNOSIS — R29818 Other symptoms and signs involving the nervous system: Secondary | ICD-10-CM | POA: Diagnosis not present

## 2019-12-06 DIAGNOSIS — R531 Weakness: Secondary | ICD-10-CM | POA: Diagnosis not present

## 2019-12-06 DIAGNOSIS — H538 Other visual disturbances: Secondary | ICD-10-CM | POA: Diagnosis not present

## 2019-12-06 DIAGNOSIS — I6523 Occlusion and stenosis of bilateral carotid arteries: Secondary | ICD-10-CM | POA: Diagnosis not present

## 2019-12-06 DIAGNOSIS — R519 Headache, unspecified: Secondary | ICD-10-CM | POA: Diagnosis not present

## 2019-12-06 DIAGNOSIS — R42 Dizziness and giddiness: Secondary | ICD-10-CM | POA: Diagnosis not present

## 2019-12-10 DIAGNOSIS — M545 Low back pain: Secondary | ICD-10-CM | POA: Diagnosis not present

## 2019-12-10 DIAGNOSIS — Z8546 Personal history of malignant neoplasm of prostate: Secondary | ICD-10-CM | POA: Diagnosis not present

## 2019-12-10 DIAGNOSIS — F419 Anxiety disorder, unspecified: Secondary | ICD-10-CM | POA: Diagnosis not present

## 2019-12-10 DIAGNOSIS — Z87898 Personal history of other specified conditions: Secondary | ICD-10-CM | POA: Diagnosis not present

## 2019-12-10 DIAGNOSIS — N400 Enlarged prostate without lower urinary tract symptoms: Secondary | ICD-10-CM | POA: Diagnosis not present

## 2019-12-10 DIAGNOSIS — K219 Gastro-esophageal reflux disease without esophagitis: Secondary | ICD-10-CM | POA: Diagnosis not present

## 2019-12-10 DIAGNOSIS — Z09 Encounter for follow-up examination after completed treatment for conditions other than malignant neoplasm: Secondary | ICD-10-CM | POA: Diagnosis not present

## 2019-12-10 DIAGNOSIS — I2510 Atherosclerotic heart disease of native coronary artery without angina pectoris: Secondary | ICD-10-CM | POA: Diagnosis not present

## 2019-12-25 ENCOUNTER — Other Ambulatory Visit: Payer: Self-pay

## 2019-12-25 ENCOUNTER — Ambulatory Visit: Payer: Medicare HMO | Admitting: Cardiology

## 2019-12-25 ENCOUNTER — Encounter: Payer: Self-pay | Admitting: Cardiology

## 2019-12-25 VITALS — BP 120/70 | HR 80 | Ht 66.0 in | Wt 166.6 lb

## 2019-12-25 DIAGNOSIS — I5022 Chronic systolic (congestive) heart failure: Secondary | ICD-10-CM | POA: Diagnosis not present

## 2019-12-25 DIAGNOSIS — I255 Ischemic cardiomyopathy: Secondary | ICD-10-CM | POA: Diagnosis not present

## 2019-12-25 DIAGNOSIS — I10 Essential (primary) hypertension: Secondary | ICD-10-CM

## 2019-12-25 DIAGNOSIS — Z95810 Presence of automatic (implantable) cardiac defibrillator: Secondary | ICD-10-CM

## 2019-12-25 NOTE — Patient Instructions (Signed)
Medication Instructions:  Your physician recommends that you continue on your current medications as directed. Please refer to the Current Medication list given to you today.  *If you need a refill on your cardiac medications before your next appointment, please call your pharmacy*   Lab Work: Your physician recommends that you return for lab work today: bmp, pro bnp   If you have labs (blood work) drawn today and your tests are completely normal, you will receive your results only by: . MyChart Message (if you have MyChart) OR . A paper copy in the mail If you have any lab test that is abnormal or we need to change your treatment, we will call you to review the results.   Testing/Procedures: Your physician has requested that you have an echocardiogram. Echocardiography is a painless test that uses sound waves to create images of your heart. It provides your doctor with information about the size and shape of your heart and how well your heart's chambers and valves are working. This procedure takes approximately one hour. There are no restrictions for this procedure.     Follow-Up: At [facility], you and your health needs are our priority.  As part of our continuing mission to provide you with exceptional heart care, we have created designated Provider Care Teams.  These Care Teams include your primary Cardiologist (physician) and Advanced Practice Providers (APPs -  Physician Assistants and Nurse Practitioners) who all work together to provide you with the care you need, when you need it.  We recommend signing up for the patient portal called "MyChart".  Sign up information is provided on this After Visit Summary.  MyChart is used to connect with patients for Virtual Visits (Telemedicine).  Patients are able to view lab/test results, encounter notes, upcoming appointments, etc.  Non-urgent messages can be sent to your provider as well.   To learn more about what you can do with MyChart,  go to [email or web address].    Your next appointment:   3 month(s)  The format for your next appointment:   In Person  Provider:   Robert Krasowski, MD   Other Instructions   Echocardiogram An echocardiogram is a procedure that uses painless sound waves (ultrasound) to produce an image of the heart. Images from an echocardiogram can provide important information about:  Signs of coronary artery disease (CAD).  Aneurysm detection. An aneurysm is a weak or damaged part of an artery wall that bulges out from the normal force of blood pumping through the body.  Heart size and shape. Changes in the size or shape of the heart can be associated with certain conditions, including heart failure, aneurysm, and CAD.  Heart muscle function.  Heart valve function.  Signs of a past heart attack.  Fluid buildup around the heart.  Thickening of the heart muscle.  A tumor or infectious growth around the heart valves. Tell a health care provider about:  Any allergies you have.  All medicines you are taking, including vitamins, herbs, eye drops, creams, and over-the-counter medicines.  Any blood disorders you have.  Any surgeries you have had.  Any medical conditions you have.  Whether you are pregnant or may be pregnant. What are the risks? Generally, this is a safe procedure. However, problems may occur, including:  Allergic reaction to dye (contrast) that may be used during the procedure. What happens before the procedure? No specific preparation is needed. You may eat and drink normally. What happens during the procedure?   An   IV tube may be inserted into one of your veins.  You may receive contrast through this tube. A contrast is an injection that improves the quality of the pictures from your heart.  A gel will be applied to your chest.  A wand-like tool (transducer) will be moved over your chest. The gel will help to transmit the sound waves from the  transducer.  The sound waves will harmlessly bounce off of your heart to allow the heart images to be captured in real-time motion. The images will be recorded on a computer. The procedure may vary among health care providers and hospitals. What happens after the procedure?  You may return to your normal, everyday life, including diet, activities, and medicines, unless your health care provider tells you not to do that. Summary  An echocardiogram is a procedure that uses painless sound waves (ultrasound) to produce an image of the heart.  Images from an echocardiogram can provide important information about the size and shape of your heart, heart muscle function, heart valve function, and fluid buildup around your heart.  You do not need to do anything to prepare before this procedure. You may eat and drink normally.  After the echocardiogram is completed, you may return to your normal, everyday life, unless your health care provider tells you not to do that. This information is not intended to replace advice given to you by your health care provider. Make sure you discuss any questions you have with your health care provider. Document Revised: 11/09/2018 Document Reviewed: 08/21/2016 Elsevier Patient Education  2020 Elsevier Inc.   

## 2019-12-25 NOTE — Progress Notes (Signed)
Cardiology Office Note:    Date:  12/25/2019   ID:  James Jenkins, DOB [birth year], MRN [id number]  PCP:  James Johns, MD  Cardiologist:  James Campus, MD    Referring MD: James Johns, MD   Chief Complaint  Patient presents with  . Follow-up    3 MO FU   I am doing well  History of Present Illness:    James Jenkins is a 74 y.o. male with past medical history significant for cardiomyopathy, latest estimation of ejection fraction 40 to 45%, ICD placement which is single-chamber, coronary artery disease, dementia.  Comes today to my office to follow up.  Initially he said everything is fine denies having a problem he works in the garden does some woodworking have no difficulty doing it then he said he passed out about 3 weeks ago.  He is very unclear about circumstances in which he passed out first he said he was sitting got up and passed out then he told me that he was talking on the phone with some nurse regarding colonoscopy and then started feeling poorly and passed out.  It is very difficult to get a good story out of him regardless we need to investigate this event.  Since that time he is doing well denies have any dizziness or passing out.  Past Medical History:  Diagnosis Date  . Coronary artery disease   . ICD (implantable cardioverter-defibrillator) in place    primary prevention, medtronic  . Ischemic cardiomyopathy     Past Surgical History:  Procedure Laterality Date  . CHOLECYSTECTOMY    . HERNIA REPAIR    . ICD IMPLANT     MDT    Current Medications: Current Meds  Medication Sig  . ALPRAZolam (XANAX) 0.25 MG tablet   . aspirin 81 MG chewable tablet Chew 1 tablet by mouth daily.  Marland Kitchen buPROPion (WELLBUTRIN XL) 300 MG 24 hr tablet   . donepezil (ARICEPT) 10 MG tablet Take 10 mg by mouth daily.  . enalapril (VASOTEC) 5 MG tablet Take 1 tablet (5 mg total) by mouth daily.  . fluticasone (FLONASE) 50 MCG/ACT nasal spray Place 2 sprays into both nostrils daily.   . furosemide (LASIX) 20 MG tablet Take 2 tablets (40 mg total) by mouth daily.  . isosorbide mononitrate (IMDUR) 30 MG 24 hr tablet Take 1 tablet by mouth daily.  . memantine (NAMENDA XR) 14 MG CP24 24 hr capsule   . metoprolol succinate (TOPROL-XL) 25 MG 24 hr tablet Take 1 tablet by mouth daily.  . mirtazapine (REMERON) 7.5 MG tablet   . nitroGLYCERIN (NITROSTAT) 0.4 MG SL tablet Place 1 tablet (0.4 mg total) under the tongue as needed for chest pain.  . pantoprazole (PROTONIX) 40 MG tablet Take 1 tablet by mouth daily.  . simvastatin (ZOCOR) 40 MG tablet Take 1 tablet by mouth daily.  . tamsulosin (FLOMAX) 0.4 MG CAPS capsule Take 1 capsule by mouth daily.     Allergies:   Bee venom and Adhesive  [tape]   Social History   Socioeconomic History  . Marital status: Married    Spouse name: Not on file  . Number of children: Not on file  . Years of education: Not on file  . Highest education level: Not on file  Occupational History  . Not on file  Tobacco Use  . Smoking status: Former Smoker    Types: Cigarettes  . Smokeless tobacco: Never Used  Substance and Sexual Activity  . Alcohol  use: Never  . Drug use: Never  . Sexual activity: Not on file  Other Topics Concern  . Not on file  Social History Narrative  . Not on file   Social Determinants of Health   Financial Resource Strain:   . Difficulty of Paying Living Expenses:   Food Insecurity:   . Worried About Charity fundraiser in the Last Year:   . Arboriculturist in the Last Year:   Transportation Needs:   . Film/video editor (Medical):   Marland Kitchen Lack of Transportation (Non-Medical):   Physical Activity:   . Days of Exercise per Week:   . Minutes of Exercise per Session:   Stress:   . Feeling of Stress :   Social Connections:   . Frequency of Communication with Friends and Family:   . Frequency of Social Gatherings with Friends and Family:   . Attends Religious Services:   . Active Member of Clubs or  Organizations:   . Attends Archivist Meetings:   Marland Kitchen Marital Status:      Family History: The patient's family history includes Cancer in his brother; Diabetes in his sister; Heart attack in his sister; Heart disease in his brother and father. ROS:   Please see the history of present illness.    All 14 point review of systems negative except as described per history of present illness  EKGs/Labs/Other Studies Reviewed:      Recent Labs: 01/22/2019: BUN 16; Creatinine, Ser 1.23; Potassium 4.4; Sodium 140  Recent Lipid Panel No results found for: CHOL, TRIG, HDL, CHOLHDL, VLDL, LDLCALC, LDLDIRECT  Physical Exam:    VS:  BP 120/70   Pulse 80   Ht 5\' 6"  (1.676 m)   Wt 166 lb 9.6 oz (75.6 kg)   SpO2 98%   BMI 26.89 kg/m     Wt Readings from Last 3 Encounters:  12/25/19 166 lb 9.6 oz (75.6 kg)  07/19/19 169 lb (76.7 kg)  11/28/18 160 lb (72.6 kg)     GEN:  Well nourished, well developed in no acute distress HEENT: Normal NECK: No JVD; No carotid bruits LYMPHATICS: No lymphadenopathy CARDIAC: RRR, no murmurs, no rubs, no gallops RESPIRATORY:  Clear to auscultation without rales, wheezing or rhonchi  ABDOMEN: Soft, non-tender, non-distended MUSCULOSKELETAL:  No edema; No deformity  SKIN: Warm and dry LOWER EXTREMITIES: no swelling NEUROLOGIC:  Alert and oriented x 3 PSYCHIATRIC:  Normal affect   ASSESSMENT:    1. Ischemic cardiomyopathy   2. Essential hypertension   3. Presence of single chamber implantable cardioverter-defibrillator (ICD) Medtronic device    PLAN:    In order of problems listed above:  1. Ischemic cardiomyopathy I will ask him to have echocardiogram done to check left ventricle ejection fraction.  He is on Vasotec which we will continue.  He is on Toprol-XL which I will continue.  His blood pressure today is 120/70 he appears to be hemodynamically compensated.  I did review interrogation of his device from 3 months ago which showed OptiVol  going up.  Again will interrogate device again and make sure that nothing happened when he passed out intensive arrhythmia. 2. Essential hypertension: Blood pressure well controlled continue present management. 3. ICD present, will make interrogation of the device. 4. As a part of evaluation I will schedule him to have echocardiogram done as well as laboratory test.  I see him back in 3 months   Medication Adjustments/Labs and Tests Ordered: Current medicines are  reviewed at length with the patient today.  Concerns regarding medicines are outlined above.  No orders of the defined types were placed in this encounter.  Medication changes: No orders of the defined types were placed in this encounter.   Signed, Park Liter, MD, [facility] 12/25/2019 11:38 AM    [place]

## 2019-12-26 ENCOUNTER — Telehealth: Payer: Self-pay | Admitting: Emergency Medicine

## 2019-12-26 ENCOUNTER — Telehealth: Payer: Self-pay

## 2019-12-26 LAB — BASIC METABOLIC PANEL
BUN/Creatinine Ratio: 14 (ref 10–24)
BUN: 14 mg/dL (ref 8–27)
CO2: 24 mmol/L (ref 20–29)
Calcium: 9.9 mg/dL (ref 8.6–10.2)
Chloride: 107 mmol/L — ABNORMAL HIGH (ref 96–106)
Creatinine, Ser: 1.03 mg/dL (ref 0.76–1.27)
GFR calc Af Amer: 82 mL/min/{1.73_m2} (ref >59)
GFR calc non Af Amer: 71 mL/min/{1.73_m2} (ref >59)
Glucose: 117 mg/dL — ABNORMAL HIGH (ref 65–99)
Potassium: 4.6 mmol/L (ref 3.5–5.2)
Sodium: 140 mmol/L (ref 134–144)

## 2019-12-26 LAB — PRO B NATRIURETIC PEPTIDE: NT-Pro BNP: 256 pg/mL (ref 0–376)

## 2019-12-26 NOTE — Telephone Encounter (Signed)
Patient attempted to send transmission. Error code received that hand held piece of the remote monitor needs to charge. Patient to allow receiver to charge for 30 minutes and attempt to send transmission. If patient is unable to send after that time they will contact the device clinic.

## 2019-12-26 NOTE — Telephone Encounter (Signed)
Called patient requested manual transmission. Patient states he will send one now. Will follow up on transmission.

## 2019-12-27 NOTE — Telephone Encounter (Signed)
Called patient in regards to no transmission received. Will email [employer] to send patient new transmitter d/t patients battery dead per patient. Will call patient back at 1500 to inform patient per patient request.

## 2019-12-27 NOTE — Telephone Encounter (Signed)
Patient reports screen on monitor is not charging. Email sent to [employer] requesting new monitor as well as return kit. Patient informed and verbalizes understating. Patient advised to call DC back if he has any further questions or concerns. Verbalizes understanding.

## 2019-12-28 NOTE — Telephone Encounter (Signed)
Received email from [employer] on 12/27/19 at 5:35 PM, a monitor will be shipped and should arrive within 7-10 days.

## 2020-01-02 ENCOUNTER — Telehealth: Payer: Self-pay

## 2020-01-02 NOTE — Telephone Encounter (Signed)
Spoke with patient regarding results.  Patient verbalizes understanding and is agreeable to plan of care. Advised patient to call back with any issues or concerns.  

## 2020-01-02 NOTE — Telephone Encounter (Signed)
Per duplicate phone note, patient received [employer] monitor and requests call back for assistance with manual transmission on 01/02/20 around 4:00pm after monitor has had some time to charge.

## 2020-01-02 NOTE — Telephone Encounter (Signed)
Spoke with patient. He reports he just received his monitor and plugged it in to charge. Pt requests a call back this afternoon around 4:00pm for assistance with a manual transmission.

## 2020-01-02 NOTE — Telephone Encounter (Signed)
-----   Message from Park Liter, MD sent at 01/01/2020  6:07 PM EDT ----- Labs are looking good

## 2020-01-02 NOTE — Telephone Encounter (Signed)
Duplicate encounter. See separate phone note from 12/26/19.

## 2020-01-02 NOTE — Telephone Encounter (Signed)
Patient assisted with transmission. Remote transmission received 1 event recorded on 11/03/19 that showed  NSVT  8 beats in duration. Device function WNL. No episodes or events that correlate with syncopal episode that occurred in early May. Marland Kitchen

## 2020-01-04 DIAGNOSIS — F419 Anxiety disorder, unspecified: Secondary | ICD-10-CM | POA: Diagnosis not present

## 2020-01-04 DIAGNOSIS — D696 Thrombocytopenia, unspecified: Secondary | ICD-10-CM | POA: Diagnosis not present

## 2020-01-04 DIAGNOSIS — E785 Hyperlipidemia, unspecified: Secondary | ICD-10-CM | POA: Diagnosis not present

## 2020-01-04 DIAGNOSIS — K5909 Other constipation: Secondary | ICD-10-CM | POA: Diagnosis not present

## 2020-01-04 DIAGNOSIS — M1990 Unspecified osteoarthritis, unspecified site: Secondary | ICD-10-CM | POA: Diagnosis not present

## 2020-01-04 DIAGNOSIS — Z79899 Other long term (current) drug therapy: Secondary | ICD-10-CM | POA: Diagnosis not present

## 2020-01-07 DIAGNOSIS — E785 Hyperlipidemia, unspecified: Secondary | ICD-10-CM | POA: Diagnosis not present

## 2020-01-07 DIAGNOSIS — Z79899 Other long term (current) drug therapy: Secondary | ICD-10-CM | POA: Diagnosis not present

## 2020-01-11 ENCOUNTER — Ambulatory Visit (INDEPENDENT_AMBULATORY_CARE_PROVIDER_SITE_OTHER): Payer: Medicare HMO

## 2020-01-11 ENCOUNTER — Other Ambulatory Visit: Payer: Self-pay

## 2020-01-11 DIAGNOSIS — I5022 Chronic systolic (congestive) heart failure: Secondary | ICD-10-CM

## 2020-01-11 DIAGNOSIS — I255 Ischemic cardiomyopathy: Secondary | ICD-10-CM | POA: Diagnosis not present

## 2020-01-11 DIAGNOSIS — I10 Essential (primary) hypertension: Secondary | ICD-10-CM | POA: Diagnosis not present

## 2020-01-11 NOTE — Progress Notes (Signed)
Complete echocardiogram has been performed.  Jimmy Lashala Laser RDCS, RVT 

## 2020-01-16 ENCOUNTER — Ambulatory Visit (INDEPENDENT_AMBULATORY_CARE_PROVIDER_SITE_OTHER): Payer: Medicare HMO | Admitting: *Deleted

## 2020-01-16 DIAGNOSIS — I255 Ischemic cardiomyopathy: Secondary | ICD-10-CM | POA: Diagnosis not present

## 2020-01-16 DIAGNOSIS — I509 Heart failure, unspecified: Secondary | ICD-10-CM

## 2020-01-16 LAB — CUP PACEART REMOTE DEVICE CHECK
Battery Remaining Longevity: 93 mo
Battery Voltage: 3.01 V
Brady Statistic RV Percent Paced: 0.01 %
Date Time Interrogation Session: 20210616033321
HighPow Impedance: 50 Ohm
HighPow Impedance: 66 Ohm
Implantable Lead Implant Date: 20071009
Implantable Lead Location: 753860
Implantable Lead Model: 6949
Implantable Pulse Generator Implant Date: 20160831
Lead Channel Impedance Value: 323 Ohm
Lead Channel Impedance Value: 437 Ohm
Lead Channel Pacing Threshold Amplitude: 1 V
Lead Channel Pacing Threshold Pulse Width: 0.4 ms
Lead Channel Sensing Intrinsic Amplitude: 6.5 mV
Lead Channel Sensing Intrinsic Amplitude: 6.5 mV
Lead Channel Setting Pacing Amplitude: 3 V
Lead Channel Setting Pacing Pulse Width: 0.4 ms
Lead Channel Setting Sensing Sensitivity: 0.3 mV

## 2020-01-17 NOTE — Progress Notes (Signed)
Remote ICD transmission.   

## 2020-01-21 DIAGNOSIS — K4090 Unilateral inguinal hernia, without obstruction or gangrene, not specified as recurrent: Secondary | ICD-10-CM | POA: Diagnosis not present

## 2020-01-29 ENCOUNTER — Telehealth: Payer: Self-pay | Admitting: Cardiology

## 2020-01-29 DIAGNOSIS — K4090 Unilateral inguinal hernia, without obstruction or gangrene, not specified as recurrent: Secondary | ICD-10-CM

## 2020-01-29 DIAGNOSIS — R079 Chest pain, unspecified: Secondary | ICD-10-CM

## 2020-01-29 HISTORY — DX: Unilateral inguinal hernia, without obstruction or gangrene, not specified as recurrent: K40.90

## 2020-01-29 NOTE — Telephone Encounter (Signed)
   Primary Cardiologist: No primary care provider on file.  Chart reviewed as part of pre-operative protocol coverage. Patient was contacted 01/29/2020 in reference to pre-operative risk assessment for pending surgery as outlined below.  James Jenkins was last seen on 12/25/19 by Dr. Agustin Cree. Per last OV, he has history of CAD, cardiomyopathy last EF 40-45%, ICD, dementia, prior small infarct on CT head 07/2019. At most recent OV there was a complaint of possible syncope although "It is very difficult to get a good story out of him regardless we need to investigate this event." ICD interrogation showed 1 episode of 8 beats NSVT on 4/3 but nothing that correlated with syncopal episode. 2D Echo showed LVEF 40-45% with akinesis of the apical  lateral, apex, anterior apical, apical septum and the apical inferior  walls with hypokinesis in the remaining myocardial wall segments as well as the left ventricle from the mid to apex also appears to be slightly aneurysmal. There was also 59mm aneurysm of ascending aorta. Last device interrogation 01/16/20 note indicates normal. I do not see further details regarding diagnosis of CAD or ischemic testing.  Revised cardiac risk index is >11% indicating high risk of CV complications. Given recent OV with possible syncopal spell, LV dysfunction, possible history of CAD, and patient's underlying dementia (making telephone clearance challenging), and surgery requiring general anesthesia, will route to Dr. Agustin Cree for input on cardiac clearance for procedure as outlined below for inguinal hernia mesh and holding of aspirin. Dr. Agustin Cree - Please route response to P CV DIV PREOP (the pre-op pool). Thank you.   [name], PA-C 01/29/2020, 2:02 PM

## 2020-01-29 NOTE — Telephone Encounter (Signed)
° ° °  ° °  [facility] Pre-operative Risk Assessment    HEARTCARE STAFF: - Please ensure there is not already an duplicate clearance open for this procedure. - Under Visit Info/Reason for Call, type in Other and utilize the format Clearance MM/DD/YY or Clearance TBD. Do not use dashes or single digits. - If request is for dental extraction, please clarify the # of teeth to be extracted.  Request for surgical clearance:  1. What type of surgery is being performed? Inguinal repair on the left side with mesh  2. When is this surgery scheduled? 02/08/20   3. What type of clearance is required (medical clearance vs. Pharmacy clearance to hold med vs. Both)? Both  4. Are there any medications that need to be held prior to surgery and how long? Aspirin  5. Practice name and name of physician performing surgery? Dr. Melynda Ripple  6. What is the office phone number? [phone]   7.   What is the office fax number? [phone]  8.   Anesthesia type (None, local, MAC, general) ? General  James Jenkins also wanted to know what are the heart meds pt currently taking   James Jenkins 01/29/2020, 12:56 PM  _________________________________________________________________   (provider comments below)

## 2020-01-31 NOTE — Telephone Encounter (Signed)
[name] from [facility] surgical is calling to check on the status of the surgical clearance for patient. Informed [name] that this cardiac clearance still pending Dr Wendy Poet input(it is currently awaiting his review in his inbox), verbalized understanding. Tried to transfer to [place] office. There is no answer. She will call back later. Apologized for limited information. Expressed thanks.

## 2020-02-05 DIAGNOSIS — Z20822 Contact with and (suspected) exposure to covid-19: Secondary | ICD-10-CM | POA: Diagnosis not present

## 2020-02-05 NOTE — Telephone Encounter (Signed)
[name] from [facility] w/ Dr. Corena Pilgrim is calling to check on the status of the surgical clearance for this patient. She states that the hospital called her stating that the patient's last office visit note was sent to the hospital, but not the form that she had sent to our office. She advised of the urgency, since patients surgery is this Friday, 7/9.

## 2020-02-06 NOTE — Telephone Encounter (Addendum)
Attempted to reach the patient twice, however he does not answer and voicemail is not set up.   Surgery is in 2 days, reached out to Dr. Agustin Cree for comment regarding previous question by Melina Copa PA-C

## 2020-02-06 NOTE — Telephone Encounter (Addendum)
Third attempt at reaching the patient unsuccessful. Sent Epic chat message and forwarded this clearance to Dr. Agustin Cree earlier this afternoon. Since surgery is in 2 days, will need to follow up tomorrow, if no decision is made or unable to reach the patient, may need to delay surgery and move up the office visit for cardiac clearance

## 2020-02-06 NOTE — Telephone Encounter (Signed)
[name] from [facility] called stating she is still waiting on clearance and on form she faxed to be completed and signed.  She states she needs it back by today. Patient is having surgery on 02/08/20

## 2020-02-07 ENCOUNTER — Telehealth: Payer: Self-pay | Admitting: Emergency Medicine

## 2020-02-07 NOTE — Telephone Encounter (Signed)
Called informed patient per Dr. Agustin Cree that in order for him to be cleared for surgery tomorrow that we will need to obtain the cardiology scans from [facility]. I informed the patient he will ned to sign a medical release form so I can fax to them. He verbally understood and plans to come to [place] today to sign it. I let him know we still may not be able to clear him if we don't get the records today but we will try to do this. He understood no further questions.

## 2020-02-07 NOTE — Telephone Encounter (Signed)
Follow up    [place] from [facility] center is calling about clearance request  She says they need the request today or the pt will be canceled for surgery   Please advise

## 2020-02-07 NOTE — Telephone Encounter (Signed)
Checked with Dr. Posey Rea, unfortunately, this is a very difficult case as previous cardiology record has been quite limited. There was a lot of record under [place] system, but requesting those record will take some time. We can not confidently clear the patient at this time. I spoke with [name] from the [facility] surgical center [phone]). Surgery will need to be delayed until additional information can be reviewed by Dr. Agustin Cree.

## 2020-02-07 NOTE — Telephone Encounter (Signed)
Called by [name] of surgery team to follow up on cardiac clearance as the surgery is tomorrow. I was finally able to reach the patient, he says he has not had a passing out spell since the last visit. When asked about his first passing out spell prior to last visit, he mentioned since his wife died in 26-Sep-2022 and he was in a bad place. He was taking multiple "brain pills" to calm him down. 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 The only thing limit the distance he is able to walk is his hernia pain.   Previous note mentioned he has history of CAD, ICM s/p ICD with baseline EF 40-45%, dementia and CVA found incidentally on previous CT of head. Recent echo showed EF 40-45% with akinesis of apical lateral, apex, anterior apical, apical septum and apical inferior walls with hypokinesis in the remaining myocardial wall segments. Recent device interrogation was normal. Revised cardiac risk index is >11% indicating high risk of CV complications.  Unclear if EF 40-45% with wall motion abnormality is expected as record regarding prior CAD is scant. Will need to check with Dr. Agustin Cree. Given the urgency of this clearance, will forward to Dr. Agustin Cree and reach out to his nurse to see if surgery need to be delayed or can this patient be cleared.

## 2020-02-08 NOTE — Telephone Encounter (Signed)
Please see if able to obtain consent form patient to request record from [facility] system so Dr. Agustin Cree and review and decide on the clearance during next visit.

## 2020-02-12 NOTE — Telephone Encounter (Signed)
James Jenkins from [facility] calling to see if the clearance can be expedited, because she states the patient is in pain and needs the surgery.

## 2020-02-13 NOTE — Telephone Encounter (Signed)
Lets do stress test if stress test is negative will be able to proceed with surgery, he needs Lexiscan

## 2020-02-13 NOTE — Telephone Encounter (Signed)
Called patient informed him per [facility] he will need a stress test to be cleared for surgery. I went over the instructions below with him already and he is aware someone will call him with the appointment date.     [facility] [address] [place], [place] [address] Phone:  [phone]    Please arrive 15 minutes prior to your appointment time for registration and insurance purposes.  The test will take approximately 3 to 4 hours to complete; you may bring reading material.  If someone comes with you to your appointment, they will need to remain in the main lobby due to limited space in the testing area. **If you are pregnant or breastfeeding, please notify the nuclear lab prior to your appointment**  How to prepare for your Myocardial Perfusion Test: . Do not eat or drink 3 hours prior to your test, except you may have water. . Do not consume products containing caffeine (regular or decaffeinated) 12 hours prior to your test. (ex: coffee, chocolate, sodas, tea). . Do bring a list of your current medications with you.  If not listed below, you may take your medications as normal. . Do wear comfortable clothes (no dresses or overalls) and walking shoes, tennis shoes preferred (No heels or open toe shoes are allowed). . Do NOT wear cologne, perfume, aftershave, or lotions (deodorant is allowed). . If these instructions are not followed, your test will have to be rescheduled.  Please report to [address] for your test.  If you have questions or concerns about your appointment, you can call the [facility] at [phone].  If you cannot keep your appointment, please provide 24 hours notification to the Nuclear Lab, to avoid a possible $50 charge to your account.

## 2020-02-13 NOTE — Addendum Note (Signed)
Addended by: Ashok Norris on: 02/13/2020 03:48 PM   Modules accepted: Orders

## 2020-02-18 ENCOUNTER — Telehealth (HOSPITAL_COMMUNITY): Payer: Self-pay | Admitting: *Deleted

## 2020-02-18 NOTE — Telephone Encounter (Signed)
Patient given detailed instructions per Myocardial Perfusion Study Information Sheet for the test on 02/19/20. Patient notified to arrive 15 minutes early and that it is imperative to arrive on time for appointment to keep from having the test rescheduled.  If you need to cancel or reschedule your appointment, please call the office within 24 hours of your appointment. . Patient verbalized understanding. Kirstie Peri

## 2020-02-19 ENCOUNTER — Ambulatory Visit (INDEPENDENT_AMBULATORY_CARE_PROVIDER_SITE_OTHER): Payer: Medicare HMO

## 2020-02-19 ENCOUNTER — Encounter: Payer: Self-pay | Admitting: *Deleted

## 2020-02-19 ENCOUNTER — Other Ambulatory Visit: Payer: Self-pay

## 2020-02-19 DIAGNOSIS — R079 Chest pain, unspecified: Secondary | ICD-10-CM

## 2020-02-19 MED ORDER — TECHNETIUM TC 99M TETROFOSMIN IV KIT
10.7000 | PACK | Freq: Once | INTRAVENOUS | Status: AC | PRN
  Administered 2020-02-19: 10.7 via INTRAVENOUS

## 2020-02-20 ENCOUNTER — Ambulatory Visit: Payer: Medicare HMO

## 2020-02-20 DIAGNOSIS — R079 Chest pain, unspecified: Secondary | ICD-10-CM | POA: Diagnosis not present

## 2020-02-20 MED ORDER — AMINOPHYLLINE 25 MG/ML IV SOLN
150.0000 mg | Freq: Once | INTRAVENOUS | Status: AC
  Administered 2020-02-20: 150 mg via INTRAVENOUS

## 2020-02-20 MED ORDER — TECHNETIUM TC 99M TETROFOSMIN IV KIT
31.4000 | PACK | Freq: Once | INTRAVENOUS | Status: AC | PRN
  Administered 2020-02-20: 31.4 via INTRAVENOUS

## 2020-02-20 MED ORDER — REGADENOSON 0.4 MG/5ML IV SOLN
0.4000 mg | Freq: Once | INTRAVENOUS | Status: AC
  Administered 2020-02-20: 0.4 mg via INTRAVENOUS

## 2020-02-21 LAB — MYOCARDIAL PERFUSION IMAGING
LV dias vol: 152 mL (ref 62–150)
LV sys vol: 92 mL
Peak HR: 92 {beats}/min
Rest HR: 76 {beats}/min
SDS: 3
SRS: 28
SSS: 31
TID: 0.96

## 2020-02-21 NOTE — Telephone Encounter (Signed)
   Primary Cardiologist: Jenne Campus, MD  Chart reviewed as part of pre-operative protocol coverage. Given past medical history and time since last visit, based on ACC/AHA guidelines, James Jenkins would be at acceptable risk for the planned procedure without further cardiovascular testing.   Myoview stress testing done 02/20/2020 showed decreased ejection fraction consistent with his echo results.  No ischemia mentioned. Per Dr Wendy Poet previous note he is cleared for surgery.   Ok to hold aspirin 5 days pre op if needed.  I will route this recommendation to the requesting party via Epic fax function and remove from pre-op pool.  Please call with questions.  Kerin Ransom, PA-C 02/21/2020, 10:37 AM

## 2020-02-21 NOTE — Telephone Encounter (Signed)
There is old mi with periinfarct ischemia Will hold off with surgery and talk to him

## 2020-02-21 NOTE — Telephone Encounter (Signed)
Called patient informed him per Dr. Agustin Cree he can't have surgery yet and needs to be seen. Confirmed his next appointment with him. No further questions.

## 2020-02-21 NOTE — Telephone Encounter (Signed)
His stress test is not normal, needs to have f/up to discuss, hold off with surgery

## 2020-02-22 NOTE — Telephone Encounter (Signed)
Pt has appt scheduled with Dr Agustin Cree 03/27/2020 @ 8:00 AM will discuss cardiac clearance at that time

## 2020-02-22 NOTE — Telephone Encounter (Signed)
Primary Cardiologist:Robert Agustin Cree, MD  Chart reviewed as part of pre-operative protocol coverage. Because of James Jenkins's past medical history and time since last visit, he/she will require a follow-up visit in order to better assess preoperative cardiovascular risk.  Pre-op covering staff: - Please schedule appointment and call patient to inform them. - Please contact requesting surgeon's office via preferred method (i.e, phone, fax) to inform them of need for appointment prior to surgery.  If applicable, this message will also be routed to pharmacy pool and/or primary cardiologist for input on holding anticoagulant/antiplatelet agent as requested below so that this information is available at time of patient's appointment.   Deberah Pelton, NP  02/22/2020, 10:19 AM

## 2020-03-03 DIAGNOSIS — Z1152 Encounter for screening for COVID-19: Secondary | ICD-10-CM | POA: Diagnosis not present

## 2020-03-04 ENCOUNTER — Other Ambulatory Visit: Payer: Self-pay | Admitting: Cardiology

## 2020-03-04 DIAGNOSIS — I10 Essential (primary) hypertension: Secondary | ICD-10-CM | POA: Diagnosis not present

## 2020-03-04 DIAGNOSIS — Z79899 Other long term (current) drug therapy: Secondary | ICD-10-CM | POA: Diagnosis not present

## 2020-03-04 DIAGNOSIS — M159 Polyosteoarthritis, unspecified: Secondary | ICD-10-CM | POA: Diagnosis not present

## 2020-03-04 DIAGNOSIS — F418 Other specified anxiety disorders: Secondary | ICD-10-CM | POA: Diagnosis not present

## 2020-03-04 DIAGNOSIS — E559 Vitamin D deficiency, unspecified: Secondary | ICD-10-CM | POA: Diagnosis not present

## 2020-03-04 DIAGNOSIS — Z09 Encounter for follow-up examination after completed treatment for conditions other than malignant neoplasm: Secondary | ICD-10-CM | POA: Diagnosis not present

## 2020-03-04 DIAGNOSIS — R600 Localized edema: Secondary | ICD-10-CM | POA: Diagnosis not present

## 2020-03-04 DIAGNOSIS — I700 Atherosclerosis of aorta: Secondary | ICD-10-CM | POA: Diagnosis not present

## 2020-03-05 IMAGING — CT CT HEAD W/O CM
3 series · 14 of 47 positions shown, 16 images · non-contrast
Comparison: February 17, 2017

CLINICAL DATA: Dementia.  Dizziness.  Recent fall.  Headache.

EXAM:
CT HEAD WITHOUT CONTRAST
TECHNIQUE: Contiguous axial images were obtained from the base of the skull
through the vertex without intravenous contrast.

[Series 2: head wo · axial · 0.47mm/px · z∈[-100,+34]mm · 8 of 33 slices shown, 10 images]
[im 3/33  brain]
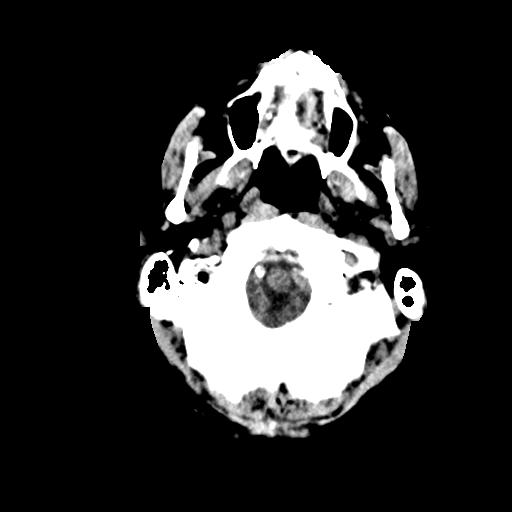
[im 3/33  bone]
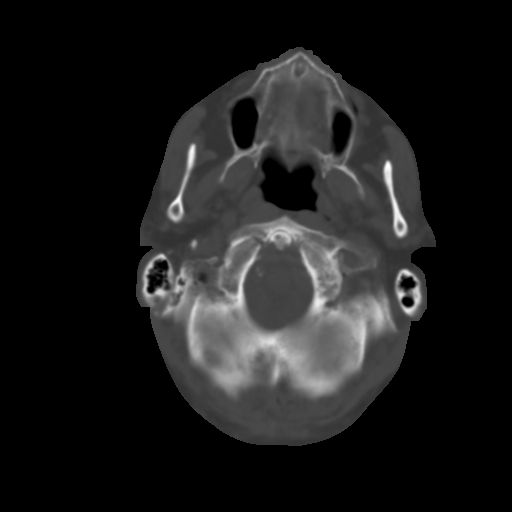
[im 7/33  brain]
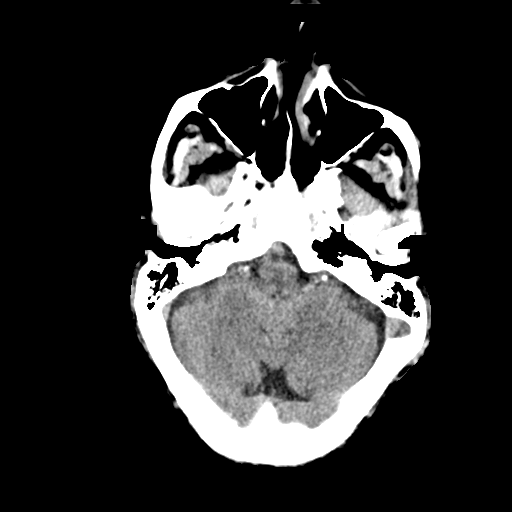
[im 10/33  brain]
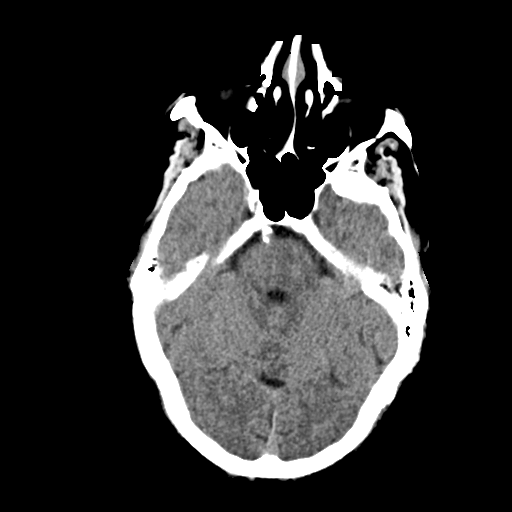
[im 15/33  brain]
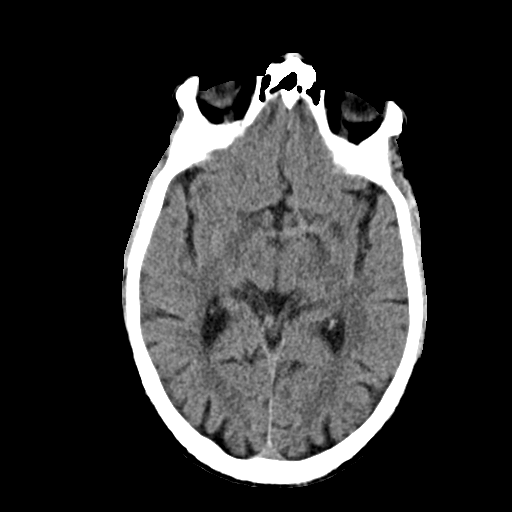
[im 18/33  brain]
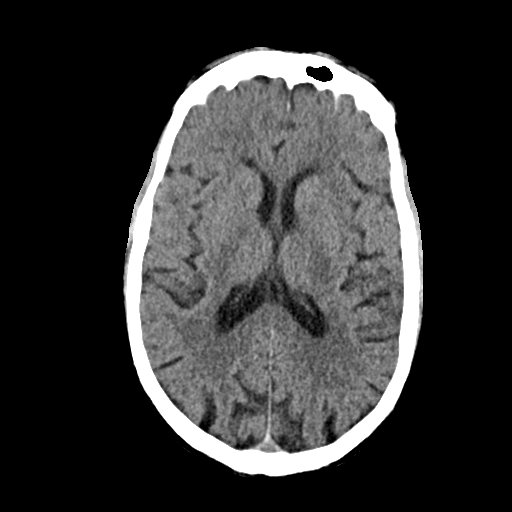
[im 18/33  bone]
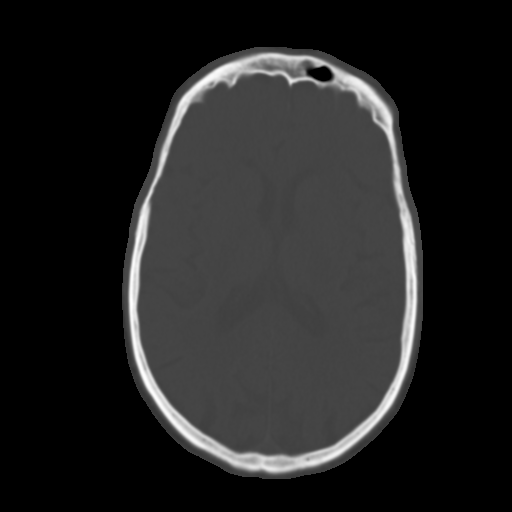
[im 23/33  brain]
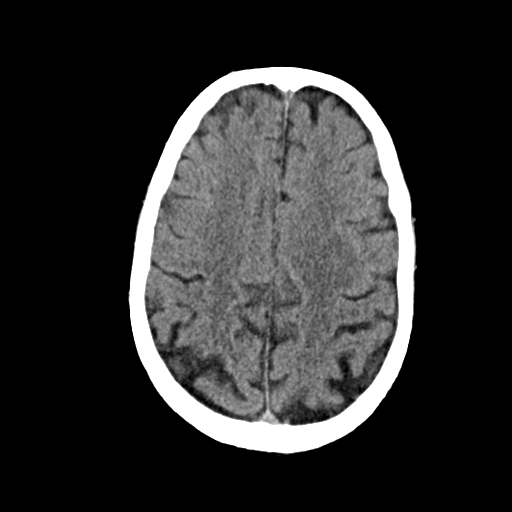
[im 26/33  brain]
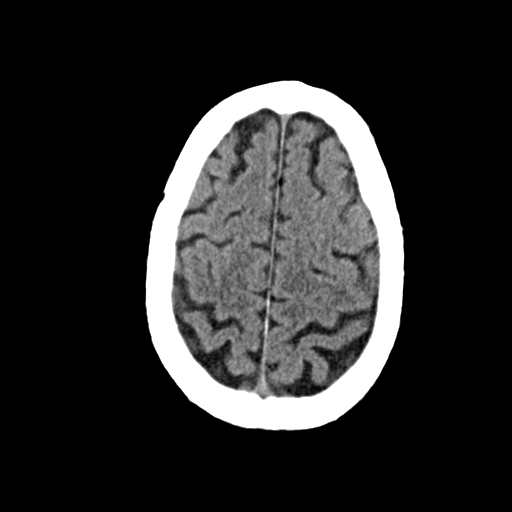
[im 30/33  brain]
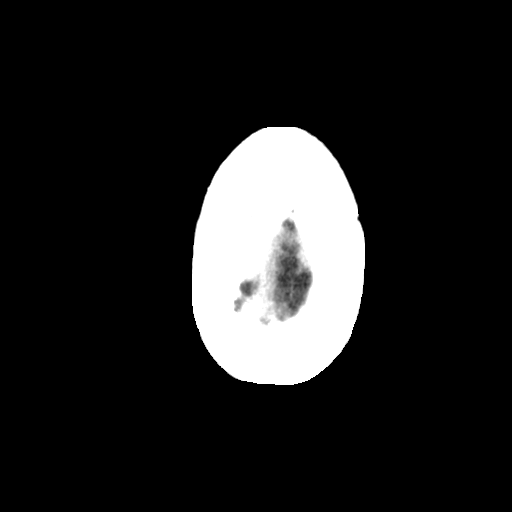

[Series 4: coronal soft · coronal · 0.32mm/px · 3 of 77 slices shown]
[im 26/77  brain]
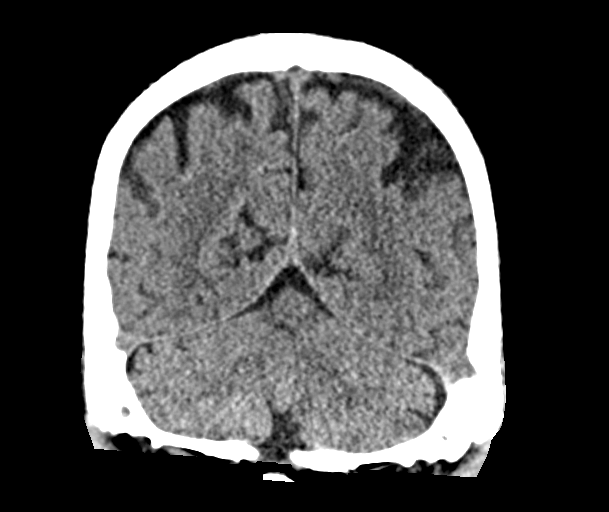
[im 34/77  brain]
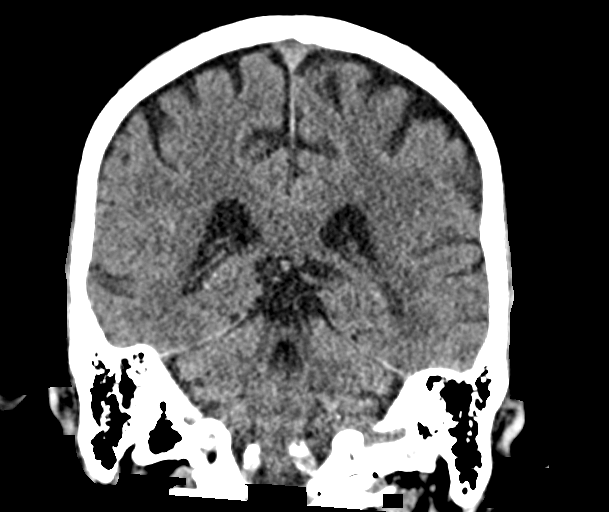
[im 43/77  brain]
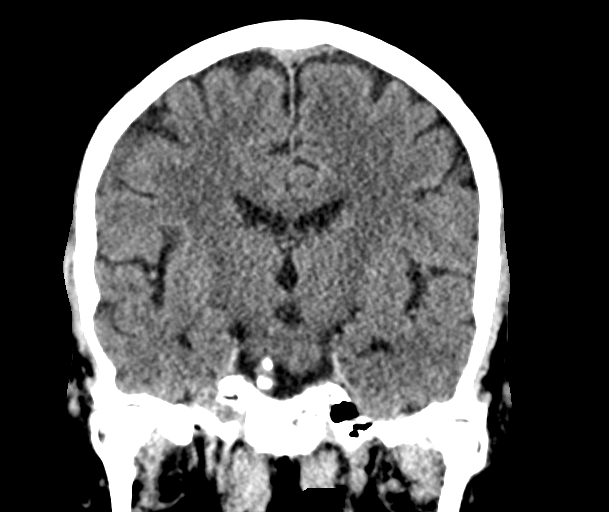

[Series 5: sag soft · sagittal · 0.32mm/px · 3 of 66 slices shown]
[im 22/66  brain]
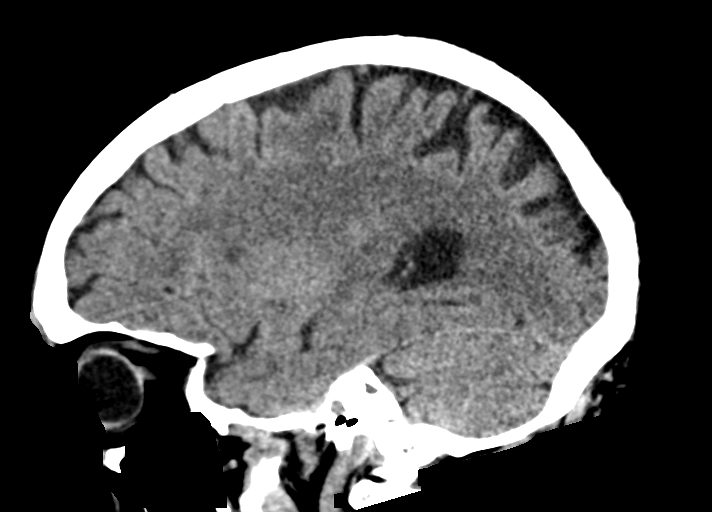
[im 33/66  brain]
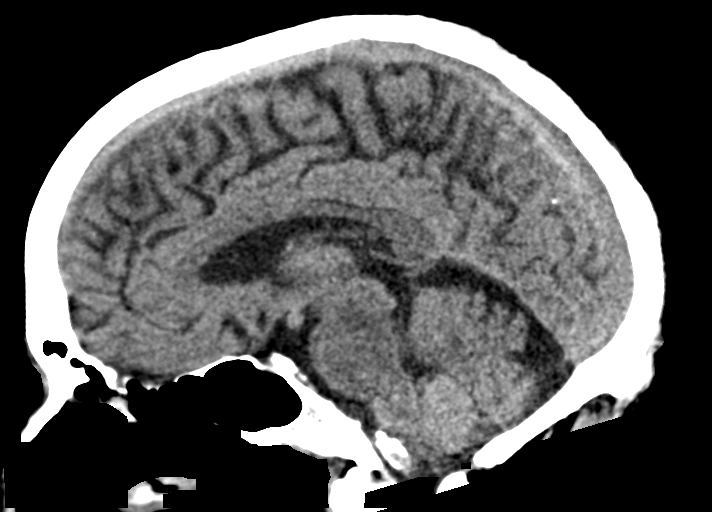
[im 44/66  brain]
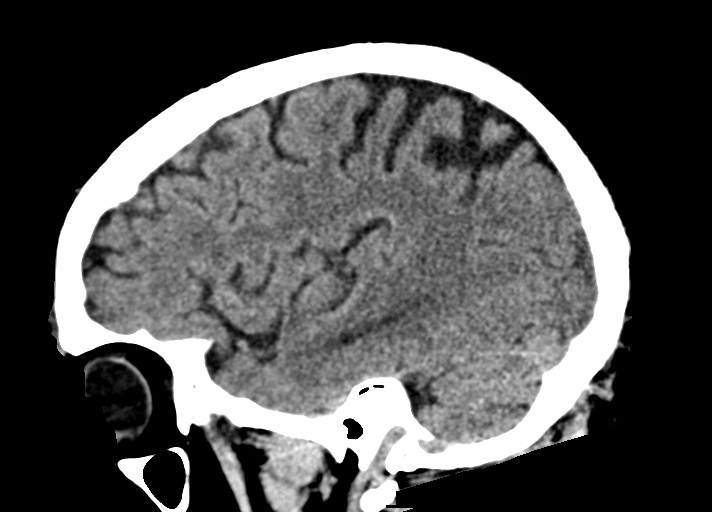

[14 of 47 positions shown; findings below may reference images not displayed]

FINDINGS: Brain: Ventricles appear within normal limits for age. There is mild
to moderate symmetric parietal lobe atrophy bilaterally. There is no
intracranial mass, hemorrhage, extra-axial fluid collection, or
midline shift. There is mild small vessel disease in the centra
semiovale bilaterally. There is a prior small infarct in the
anterior limb of the right external capsule, stable. No acute
appearing infarct is evident.

Vascular: No hyperdense vessel. There are multiple foci of arterial
vascular calcification in the right carotid siphon in both distal
vertebral arteries.

Skull: The bony calvarium appears intact.

Sinuses/Orbits: There is mucosal thickening in several ethmoid air
cells. Other paranasal sinuses are clear. Orbits appear symmetric
bilaterally.

Other: Mastoid air cells are clear.
IMPRESSION: Mild-to-moderate symmetric parietal lobe atrophy bilaterally.
Ventricles appear normal in size and configuration.

There is mild periventricular small vessel disease. Prior small
infarct in the anterior limb of the right external capsule. No acute
appearing infarct. No mass or hemorrhage.

Multiple foci of arterial vascular calcification noted. There is
mucosal thickening in several ethmoid air cells.

## 2020-03-05 NOTE — Telephone Encounter (Signed)
Patient is calling stating he really wants to have his hernia surgery because he can not hardly walk due to it hurting so bad. He states that he is not afraid to die and go to heaven and that he is not worried about his heart and Dr. Agustin Cree shouldn't be worried either.

## 2020-03-05 NOTE — Telephone Encounter (Signed)
Follow Up  James Jenkins from [place] is calling in to follow up about Clearance. Needs some clarification. Please give a call back.

## 2020-03-05 NOTE — Telephone Encounter (Signed)
I talked to James Jenkins with surgery and confirmed no surgery until pt seen by Dr. Agustin Cree on the 26th of August - Dr. Agustin Cree will decide on clearance.   Apologized for confusion.

## 2020-03-06 NOTE — Telephone Encounter (Signed)
Called patient explained to him that Dr. Agustin Cree can not clear him at this time due to abnormal stress test results and that he needs an appointment to discuss this. He was very upset with this and said he was going throw all his medication away in the toilet and find a new doctor. I advised him throwing all medication away is not advisable and he shouldn't do this. He told me that he didn't care and hung up. Will inform Dr. Agustin Cree.

## 2020-03-06 NOTE — Telephone Encounter (Signed)
Please schedule follow-up within next few weeks so he can discuss this issue

## 2020-03-13 DIAGNOSIS — M25552 Pain in left hip: Secondary | ICD-10-CM | POA: Diagnosis not present

## 2020-03-13 DIAGNOSIS — M545 Low back pain: Secondary | ICD-10-CM | POA: Diagnosis not present

## 2020-03-13 DIAGNOSIS — K219 Gastro-esophageal reflux disease without esophagitis: Secondary | ICD-10-CM | POA: Diagnosis not present

## 2020-03-13 DIAGNOSIS — I2510 Atherosclerotic heart disease of native coronary artery without angina pectoris: Secondary | ICD-10-CM | POA: Diagnosis not present

## 2020-03-25 DIAGNOSIS — M8588 Other specified disorders of bone density and structure, other site: Secondary | ICD-10-CM | POA: Diagnosis not present

## 2020-03-25 DIAGNOSIS — J432 Centrilobular emphysema: Secondary | ICD-10-CM | POA: Diagnosis not present

## 2020-03-25 DIAGNOSIS — I700 Atherosclerosis of aorta: Secondary | ICD-10-CM | POA: Diagnosis not present

## 2020-03-25 DIAGNOSIS — R911 Solitary pulmonary nodule: Secondary | ICD-10-CM | POA: Diagnosis not present

## 2020-03-25 DIAGNOSIS — I2510 Atherosclerotic heart disease of native coronary artery without angina pectoris: Secondary | ICD-10-CM | POA: Diagnosis not present

## 2020-03-25 DIAGNOSIS — I712 Thoracic aortic aneurysm, without rupture: Secondary | ICD-10-CM | POA: Diagnosis not present

## 2020-03-27 ENCOUNTER — Ambulatory Visit: Payer: Medicare HMO | Admitting: Cardiology

## 2020-03-30 DIAGNOSIS — I2510 Atherosclerotic heart disease of native coronary artery without angina pectoris: Secondary | ICD-10-CM | POA: Diagnosis not present

## 2020-03-30 DIAGNOSIS — J449 Chronic obstructive pulmonary disease, unspecified: Secondary | ICD-10-CM | POA: Diagnosis not present

## 2020-03-30 DIAGNOSIS — E785 Hyperlipidemia, unspecified: Secondary | ICD-10-CM | POA: Diagnosis not present

## 2020-03-30 DIAGNOSIS — Z86718 Personal history of other venous thrombosis and embolism: Secondary | ICD-10-CM | POA: Diagnosis not present

## 2020-03-30 DIAGNOSIS — I509 Heart failure, unspecified: Secondary | ICD-10-CM | POA: Diagnosis not present

## 2020-03-30 DIAGNOSIS — R0789 Other chest pain: Secondary | ICD-10-CM | POA: Diagnosis not present

## 2020-03-30 DIAGNOSIS — I739 Peripheral vascular disease, unspecified: Secondary | ICD-10-CM | POA: Diagnosis not present

## 2020-03-30 DIAGNOSIS — I110 Hypertensive heart disease with heart failure: Secondary | ICD-10-CM | POA: Diagnosis not present

## 2020-03-30 DIAGNOSIS — I252 Old myocardial infarction: Secondary | ICD-10-CM | POA: Diagnosis not present

## 2020-04-10 DIAGNOSIS — S2241XD Multiple fractures of ribs, right side, subsequent encounter for fracture with routine healing: Secondary | ICD-10-CM | POA: Diagnosis not present

## 2020-04-16 ENCOUNTER — Ambulatory Visit (INDEPENDENT_AMBULATORY_CARE_PROVIDER_SITE_OTHER): Payer: Medicare HMO | Admitting: *Deleted

## 2020-04-16 DIAGNOSIS — I255 Ischemic cardiomyopathy: Secondary | ICD-10-CM | POA: Diagnosis not present

## 2020-04-16 LAB — CUP PACEART REMOTE DEVICE CHECK
Battery Remaining Longevity: 89 mo
Battery Voltage: 3.01 V
Brady Statistic RV Percent Paced: 0.01 %
Date Time Interrogation Session: 20210915001704
HighPow Impedance: 48 Ohm
HighPow Impedance: 65 Ohm
Implantable Lead Implant Date: 20071009
Implantable Lead Location: 753860
Implantable Lead Model: 6949
Implantable Pulse Generator Implant Date: 20160831
Lead Channel Impedance Value: 342 Ohm
Lead Channel Impedance Value: 456 Ohm
Lead Channel Pacing Threshold Amplitude: 1.25 V
Lead Channel Pacing Threshold Pulse Width: 0.4 ms
Lead Channel Sensing Intrinsic Amplitude: 5.875 mV
Lead Channel Sensing Intrinsic Amplitude: 5.875 mV
Lead Channel Setting Pacing Amplitude: 2.5 V
Lead Channel Setting Pacing Pulse Width: 0.4 ms
Lead Channel Setting Sensing Sensitivity: 0.3 mV

## 2020-04-17 DIAGNOSIS — C61 Malignant neoplasm of prostate: Secondary | ICD-10-CM | POA: Diagnosis not present

## 2020-04-17 NOTE — Progress Notes (Signed)
Remote ICD transmission.   

## 2020-04-25 DIAGNOSIS — F419 Anxiety disorder, unspecified: Secondary | ICD-10-CM | POA: Diagnosis not present

## 2020-06-10 ENCOUNTER — Ambulatory Visit: Payer: Medicare HMO | Admitting: Cardiology

## 2020-06-24 DIAGNOSIS — Z79899 Other long term (current) drug therapy: Secondary | ICD-10-CM | POA: Diagnosis not present

## 2020-06-24 DIAGNOSIS — E559 Vitamin D deficiency, unspecified: Secondary | ICD-10-CM | POA: Diagnosis not present

## 2020-06-25 DIAGNOSIS — I700 Atherosclerosis of aorta: Secondary | ICD-10-CM | POA: Diagnosis not present

## 2020-06-25 DIAGNOSIS — I2510 Atherosclerotic heart disease of native coronary artery without angina pectoris: Secondary | ICD-10-CM | POA: Diagnosis not present

## 2020-06-25 DIAGNOSIS — Z8546 Personal history of malignant neoplasm of prostate: Secondary | ICD-10-CM | POA: Diagnosis not present

## 2020-06-25 DIAGNOSIS — E559 Vitamin D deficiency, unspecified: Secondary | ICD-10-CM | POA: Diagnosis not present

## 2020-06-25 DIAGNOSIS — K219 Gastro-esophageal reflux disease without esophagitis: Secondary | ICD-10-CM | POA: Diagnosis not present

## 2020-06-25 DIAGNOSIS — Z79899 Other long term (current) drug therapy: Secondary | ICD-10-CM | POA: Diagnosis not present

## 2020-06-25 DIAGNOSIS — N400 Enlarged prostate without lower urinary tract symptoms: Secondary | ICD-10-CM | POA: Diagnosis not present

## 2020-06-25 DIAGNOSIS — E785 Hyperlipidemia, unspecified: Secondary | ICD-10-CM | POA: Diagnosis not present

## 2020-06-25 DIAGNOSIS — F419 Anxiety disorder, unspecified: Secondary | ICD-10-CM | POA: Diagnosis not present

## 2020-07-16 ENCOUNTER — Ambulatory Visit (INDEPENDENT_AMBULATORY_CARE_PROVIDER_SITE_OTHER): Payer: Medicare HMO

## 2020-07-16 DIAGNOSIS — I255 Ischemic cardiomyopathy: Secondary | ICD-10-CM

## 2020-07-16 LAB — CUP PACEART REMOTE DEVICE CHECK
Battery Remaining Longevity: 84 mo
Battery Voltage: 3.01 V
Brady Statistic RV Percent Paced: 0.01 %
Date Time Interrogation Session: 20211215022724
HighPow Impedance: 46 Ohm
HighPow Impedance: 60 Ohm
Implantable Lead Implant Date: 20071009
Implantable Lead Location: 753860
Implantable Lead Model: 6949
Implantable Pulse Generator Implant Date: 20160831
Lead Channel Impedance Value: 342 Ohm
Lead Channel Impedance Value: 437 Ohm
Lead Channel Pacing Threshold Amplitude: 1.375 V
Lead Channel Pacing Threshold Pulse Width: 0.4 ms
Lead Channel Sensing Intrinsic Amplitude: 5.125 mV
Lead Channel Sensing Intrinsic Amplitude: 5.125 mV
Lead Channel Setting Pacing Amplitude: 2.75 V
Lead Channel Setting Pacing Pulse Width: 0.4 ms
Lead Channel Setting Sensing Sensitivity: 0.3 mV

## 2020-07-30 NOTE — Progress Notes (Signed)
Remote ICD transmission.   

## 2020-09-05 DIAGNOSIS — E349 Endocrine disorder, unspecified: Secondary | ICD-10-CM | POA: Diagnosis not present

## 2020-10-14 DIAGNOSIS — E559 Vitamin D deficiency, unspecified: Secondary | ICD-10-CM | POA: Diagnosis not present

## 2020-10-14 DIAGNOSIS — I10 Essential (primary) hypertension: Secondary | ICD-10-CM | POA: Diagnosis not present

## 2020-10-14 DIAGNOSIS — S61402A Unspecified open wound of left hand, initial encounter: Secondary | ICD-10-CM | POA: Diagnosis not present

## 2020-10-14 DIAGNOSIS — E785 Hyperlipidemia, unspecified: Secondary | ICD-10-CM | POA: Diagnosis not present

## 2020-10-14 DIAGNOSIS — I700 Atherosclerosis of aorta: Secondary | ICD-10-CM | POA: Diagnosis not present

## 2020-10-14 DIAGNOSIS — I712 Thoracic aortic aneurysm, without rupture: Secondary | ICD-10-CM | POA: Diagnosis not present

## 2020-10-14 DIAGNOSIS — R600 Localized edema: Secondary | ICD-10-CM | POA: Diagnosis not present

## 2020-10-14 DIAGNOSIS — K219 Gastro-esophageal reflux disease without esophagitis: Secondary | ICD-10-CM | POA: Diagnosis not present

## 2020-10-14 DIAGNOSIS — I2510 Atherosclerotic heart disease of native coronary artery without angina pectoris: Secondary | ICD-10-CM | POA: Diagnosis not present

## 2020-10-15 ENCOUNTER — Ambulatory Visit (INDEPENDENT_AMBULATORY_CARE_PROVIDER_SITE_OTHER): Payer: Medicare Other

## 2020-10-15 DIAGNOSIS — I255 Ischemic cardiomyopathy: Secondary | ICD-10-CM

## 2020-10-16 LAB — CUP PACEART REMOTE DEVICE CHECK
Battery Remaining Longevity: 81 mo
Battery Voltage: 3 V
Brady Statistic RV Percent Paced: 0.02 %
Date Time Interrogation Session: 20220316182210
HighPow Impedance: 53 Ohm
HighPow Impedance: 72 Ohm
Implantable Lead Implant Date: 20071009
Implantable Lead Location: 753860
Implantable Lead Model: 6949
Implantable Pulse Generator Implant Date: 20160831
Lead Channel Impedance Value: 399 Ohm
Lead Channel Impedance Value: 456 Ohm
Lead Channel Pacing Threshold Amplitude: 1.25 V
Lead Channel Pacing Threshold Pulse Width: 0.4 ms
Lead Channel Sensing Intrinsic Amplitude: 6.25 mV
Lead Channel Sensing Intrinsic Amplitude: 6.25 mV
Lead Channel Setting Pacing Amplitude: 2.5 V
Lead Channel Setting Pacing Pulse Width: 0.4 ms
Lead Channel Setting Sensing Sensitivity: 0.3 mV

## 2020-10-20 ENCOUNTER — Encounter: Payer: Self-pay | Admitting: *Deleted

## 2020-10-20 ENCOUNTER — Encounter: Payer: Self-pay | Admitting: Cardiology

## 2020-10-23 NOTE — Progress Notes (Signed)
Remote ICD transmission.   

## 2020-10-24 DIAGNOSIS — L0231 Cutaneous abscess of buttock: Secondary | ICD-10-CM | POA: Diagnosis not present

## 2020-11-18 ENCOUNTER — Other Ambulatory Visit: Payer: Self-pay

## 2020-11-18 ENCOUNTER — Encounter: Payer: Self-pay | Admitting: Cardiology

## 2020-11-18 ENCOUNTER — Ambulatory Visit (INDEPENDENT_AMBULATORY_CARE_PROVIDER_SITE_OTHER): Payer: Medicare Other | Admitting: Cardiology

## 2020-11-18 VITALS — BP 144/72 | HR 63 | Ht 67.0 in | Wt 173.0 lb

## 2020-11-18 DIAGNOSIS — I10 Essential (primary) hypertension: Secondary | ICD-10-CM

## 2020-11-18 DIAGNOSIS — I255 Ischemic cardiomyopathy: Secondary | ICD-10-CM | POA: Diagnosis not present

## 2020-11-18 DIAGNOSIS — E785 Hyperlipidemia, unspecified: Secondary | ICD-10-CM | POA: Diagnosis not present

## 2020-11-18 DIAGNOSIS — Z79899 Other long term (current) drug therapy: Secondary | ICD-10-CM

## 2020-11-18 DIAGNOSIS — I252 Old myocardial infarction: Secondary | ICD-10-CM

## 2020-11-18 DIAGNOSIS — Z95810 Presence of automatic (implantable) cardiac defibrillator: Secondary | ICD-10-CM

## 2020-11-18 MED ORDER — ENTRESTO 24-26 MG PO TABS: 1.0000 | ORAL_TABLET | Freq: Two times a day (BID) | ORAL | 2 refills | Status: DC

## 2020-11-18 NOTE — Patient Instructions (Signed)
Medication Instructions:  Your physician has recommended you make the following change in your medication:   STOP: Enalapril   Once off enalapril for at least 48 hours START: Entresto 24/26 mg twice daily  *If you need a refill on your cardiac medications before your next appointment, please call your pharmacy*   Lab Work: Your physician recommends that you return for lab work in 1 week: bmp   If you have labs (blood work) drawn today and your tests are completely normal, you will receive your results only by: Marland Kitchen MyChart Message (if you have MyChart) OR . A paper copy in the mail If you have any lab test that is abnormal or we need to change your treatment, we will call you to review the results.   Testing/Procedures:  None   Follow-Up: At [facility], you and your health needs are our priority.  As part of our continuing mission to provide you with exceptional heart care, we have created designated Provider Care Teams.  These Care Teams include your primary Cardiologist (physician) and Advanced Practice Providers (APPs -  Physician Assistants and Nurse Practitioners) who all work together to provide you with the care you need, when you need it.  We recommend signing up for the patient portal called "MyChart".  Sign up information is provided on this After Visit Summary.  MyChart is used to connect with patients for Virtual Visits (Telemedicine).  Patients are able to view lab/test results, encounter notes, upcoming appointments, etc.  Non-urgent messages can be sent to your provider as well.   To learn more about what you can do with MyChart, go to [email or web address].    Your next appointment:   6 week(s)  The format for your next appointment:   In Person  Provider:   Jenne Campus, MD   Other Instructions  Sacubitril; Valsartan Oral Tablets What is this medicine? SACUBITRIL; VALSARTAN (sak UE bi tril; val SAR tan) is a combination of a neprilysin inhibitor and a  an angiotensin II receptor blocker. It treats heart failure. This medicine may be used for other purposes; ask your health care provider or pharmacist if you have questions. COMMON BRAND NAME(S): Entresto What should I tell my health care provider before I take this medicine? They need to know if you have any of these conditions:  diabetes and take a medicine that contains aliskiren  high levels of potassium in the blood  kidney disease  liver disease  low blood pressure  an unusual or allergic reaction to sacubitril; valsartan, drugs called angiotensin converting enzyme (ACE) inhibitors, angiotensin II receptor blockers (ARBs), other medicines, foods, dyes, or preservatives  pregnant or trying to get pregnant  breast-feeding How should I use this medicine? Take this medicine by mouth. Take it as directed on the prescription label at the same time every day. You can take it with or without food. If it upsets your stomach, take it with food. Keep taking it unless your health care provider tells you to stop. Talk to your health care provider about the use of this drug in children. While it may be prescribed for children as young as 1 for selected conditions, precautions do apply. Overdosage: If you think you have taken too much of this medicine contact a poison control center or emergency room at once. NOTE: This medicine is only for you. Do not share this medicine with others. What if I miss a dose? If you miss a dose, take it as soon as you can.  If it is almost time for your next dose, take only that dose. Do not take double or extra doses. What may interact with this medicine? Do not take this medicine with any of the following medicines:  aliskiren if you have diabetes  angiotensin-converting enzyme (ACE) inhibitors, like benazepril, captopril, enalapril, fosinopril, lisinopril, or ramipril  tranylcypromine This medicine may also interact with the following  medicines:  angiotensin II receptor blockers (ARBs) like azilsartan, candesartan, eprosartan, irbesartan, losartan, olmesartan, telmisartan, or valsartan  celecoxib  lithium  NSAIDS, medicines for pain and inflammation, like ibuprofen or naproxen  potassium-sparing diuretics like amiloride, spironolactone, and triamterene  potassium supplements This list may not describe all possible interactions. Give your health care provider a list of all the medicines, herbs, non-prescription drugs, or dietary supplements you use. Also tell them if you smoke, drink alcohol, or use illegal drugs. Some items may interact with your medicine. What should I watch for while using this medicine? Tell your doctor or health care provider if your symptoms do not start to get better or if they get worse. Do not become pregnant while taking this medicine. Women should inform their health care provider if they wish to become pregnant or think they might be pregnant. There is a potential for serious harm to an unborn child. Talk to your health care provider for more information. You may get drowsy or dizzy. Do not drive, use machinery, or do anything that needs mental alertness until you know how this medicine affects you. Do not stand or sit up quickly, especially if you are an older patient. This reduces the risk of dizzy or fainting spells. Alcohol may interfere with the effects of this medicine. Avoid alcoholic drinks. Avoid salt substitutes unless you are told otherwise by your health care provider. What side effects may I notice from receiving this medicine? Side effects that you should report to your doctor or health care provider as soon as possible:  allergic reactions (skin rash, itching or hives; swelling of the face, lips, or tongue)  high potassium levels (chest pain; fast, irregular heartbeat; muscle weakness)  kidney injury (trouble passing urine or change in the amount of urine)  low blood pressure  (dizziness; feeling faint or lightheaded, falls; unusually weak or tired) Side effects that usually do not require medical attention (report to your doctor or health care provider if they continue or are bothersome):  cough This list may not describe all possible side effects. Call your doctor for medical advice about side effects. You may report side effects to FDA at 1-800-FDA-1088. Where should I keep my medicine? Keep out of the reach of children and pets. Store at room temperature between 20 and 25 degrees C (68 and 77 degrees F). Protect from moisture. Keep the container tightly closed. Get rid of any unused medicine after the expiration date. To get rid of medicines that are no longer needed or have expired:  Take the medicine to a take-back program. Check with your pharmacy or law enforcement to find a location.  If you cannot return the medicine, check the label or package insert to see if the medicine should be thrown out in the garbage or flushed down the toilet. If you are not sure, ask your health care provider. If it is safe to put it in the trash, empty the medicine out of the container. Mix the medicine with cat litter, dirt, coffee grounds, or other unwanted substance. Seal the mixture in a bag or container. Put it  in the trash. NOTE: This sheet is a summary. It may not cover all possible information. If you have questions about this medicine, talk to your doctor, pharmacist, or health care provider.  2021 Elsevier/Gold Standard (2019-09-24 11:23:32)

## 2020-11-18 NOTE — Progress Notes (Signed)
Cardiology Office Note:    Date:  11/18/2020   ID:  James Jenkins, DOB 05/24/1946, MRN [id number]  PCP:  Nicholos Johns, MD  Cardiologist:  Jenne Campus, MD    Referring MD: Nicholos Johns, MD   Chief Complaint  Patient presents with  . Follow-up  I am doing fine  History of Present Illness:    James Jenkins is a 75 y.o. male with past medical history significant for ischemic cardiomyopathy, ejection fraction neighborhood of 40 to 45%, dyslipidemia, single-chamber ICD present, congestive heart failure which is compensated, anxiety disorder, dementia. Comes today to my office for follow-up.  Overall cardiac wise doing well denies have any chest pain tightness squeezing pressure burning chest he talks majority of time during the visit today about his girlfriend who is African-American and they wanted to get married but they are getting so much grief from family as well as friends that they decided not to pursue marriage.  He is very disappointed and saddened by that.  Past Medical History:  Diagnosis Date  . Allergic rhinitis   . Anxiety disorder   . BPH (benign prostatic hyperplasia) 10/22/2019  . Cardiomyopathy ([place]) 03/10/2015   GREATLY IMPROVED  . Chest pain 07/15/2018  . CHF (congestive heart failure) ([place]) 10/30/2019  . Chronic back pain    Lumbar  . CKD (chronic kidney disease), stage II 06/12/2019  . Coronary artery disease   . DVT (deep venous thrombosis) ([place]) 06/20/2017  . Dyslipidemia 09/06/2018  . Essential hypertension 03/10/2015  . GERD (gastroesophageal reflux disease)   . Hyperglycemia 06/12/2019  . Ischemic cardiomyopathy   . Left inguinal hernia 01/29/2020  . Major neurocognitive disorder ([place]) 10/04/2019  . MDD (major depressive disorder), recurrent severe, without psychosis ([place]) 10/04/2019  . Normocytic anemia, not due to blood loss 06/12/2019  . Old MI (myocardial infarction) 06/20/2017  . Osteoarthritis   . Other and unspecified hyperlipidemia 06/20/2017  .  Presence of single chamber implantable cardioverter-defibrillator (ICD) Medtronic device 09/06/2018  . Prostate cancer ([place])   . Sexual dysfunction    Erectile dysfunction and premature ejaculation  . Sigmoid diverticulitis 06/12/2019  . Thoracic aortic aneurysm ([place])   . Thrombocytopenia ([place]) 06/12/2019    Past Surgical History:  Procedure Laterality Date  . CATARACT EXTRACTION Bilateral   . CHOLECYSTECTOMY    . CORONARY ARTERY BYPASS GRAFT  2002  . HERNIA REPAIR Right    [place]  . ICD IMPLANT     MDT  . IVC FILTER INSERTION     Abdomen and bilateral lower extremeties    Current Medications: Current Meds  Medication Sig  . acetaminophen (TYLENOL) 500 MG tablet Take 500 mg by mouth daily as needed for moderate pain or mild pain (Arthritis pain).  . enalapril (VASOTEC) 5 MG tablet Take 1 tablet by mouth once daily (Patient taking differently: Take 5 mg by mouth daily.)  . ergocalciferol (VITAMIN D2) 1.25 MG (50000 UT) capsule Take 1 capsule by mouth once a week.  . fluticasone (FLONASE) 50 MCG/ACT nasal spray Place 2 sprays into both nostrils daily.  . furosemide (LASIX) 40 MG tablet Take 40 mg by mouth 2 (two) times daily.  . isosorbide mononitrate (IMDUR) 30 MG 24 hr tablet Take 1 tablet by mouth daily.  Marland Kitchen loratadine (CLARITIN) 10 MG tablet Take 10 mg by mouth daily as needed for allergies.  . metoprolol succinate (TOPROL-XL) 25 MG 24 hr tablet Take 1 tablet by mouth daily.  . montelukast (SINGULAIR) 10 MG tablet Take  10 mg by mouth at bedtime.  . nitroGLYCERIN (NITROSTAT) 0.4 MG SL tablet Place 1 tablet (0.4 mg total) under the tongue as needed for chest pain.  Marland Kitchen omeprazole (PRILOSEC) 40 MG capsule Take 1 capsule by mouth daily.  . rosuvastatin (CRESTOR) 10 MG tablet Take 10 mg by mouth at bedtime.  . tamsulosin (FLOMAX) 0.4 MG CAPS capsule Take 1 capsule by mouth daily.  . [DISCONTINUED] ALPRAZolam (XANAX) 0.25 MG tablet Take 0.25 mg by mouth daily as needed for anxiety or  sleep.  . [DISCONTINUED] ipratropium-albuterol (DUONEB) 0.5-2.5 (3) MG/3ML SOLN Take 3 mLs by nebulization every 6 (six) hours as needed (chronic allergic bronchitis).  . [DISCONTINUED] pantoprazole (PROTONIX) 40 MG tablet Take 1 tablet by mouth daily.  . [DISCONTINUED] traMADol (ULTRAM) 50 MG tablet Take 50 mg by mouth every 6 (six) hours as needed for pain.     Allergies:   Bee venom, Adhesive  [tape], and Latex   Social History   Socioeconomic History  . Marital status: Married    Spouse name: Not on file  . Number of children: Not on file  . Years of education: Not on file  . Highest education level: Not on file  Occupational History  . Not on file  Tobacco Use  . Smoking status: Former Smoker    Years: 31.00    Types: Cigarettes  . Smokeless tobacco: Never Used  Vaping Use  . Vaping Use: Never used  Substance and Sexual Activity  . Alcohol use: Never  . Drug use: Never  . Sexual activity: Not on file  Other Topics Concern  . Not on file  Social History Narrative  . Not on file   Social Determinants of Health   Financial Resource Strain: Not on file  Food Insecurity: Not on file  Transportation Needs: Not on file  Physical Activity: Not on file  Stress: Not on file  Social Connections: Not on file     Family History: The patient's family history includes Cancer in his brother; Diabetes in his sister; Heart attack in his father and sister; Heart disease in his brother and father; Hypertension in his father. ROS:   Please see the history of present illness.    All 14 point review of systems negative except as described per history of present illness  EKGs/Labs/Other Studies Reviewed:      Recent Labs: 12/25/2019: BUN 14; Creatinine, Ser 1.03; NT-Pro BNP 256; Potassium 4.6; Sodium 140  Recent Lipid Panel No results found for: CHOL, TRIG, HDL, CHOLHDL, VLDL, LDLCALC, LDLDIRECT  Physical Exam:    VS:  BP (!) 144/72 (BP Location: Left Arm, Patient Position:  Sitting)   Pulse 63   Ht 5\' 7"  (1.702 m)   Wt 173 lb (78.5 kg)   SpO2 95%   BMI 27.10 kg/m     Wt Readings from Last 3 Encounters:  11/18/20 173 lb (78.5 kg)  02/19/20 166 lb (75.3 kg)  12/25/19 166 lb 9.6 oz (75.6 kg)     GEN:  Well nourished, well developed in no acute distress HEENT: Normal NECK: No JVD; No carotid bruits LYMPHATICS: No lymphadenopathy CARDIAC: RRR, no murmurs, no rubs, no gallops RESPIRATORY:  Clear to auscultation without rales, wheezing or rhonchi  ABDOMEN: Soft, non-tender, non-distended MUSCULOSKELETAL:  No edema; No deformity  SKIN: Warm and dry LOWER EXTREMITIES: no swelling NEUROLOGIC:  Alert and oriented x 3 PSYCHIATRIC:  Normal affect   ASSESSMENT:    1. Ischemic cardiomyopathy   2. Old MI (  myocardial infarction)   3. Essential hypertension   4. Dyslipidemia   5. Presence of single chamber implantable cardioverter-defibrillator (ICD) Medtronic device    PLAN:    In order of problems listed above:  1. Ischemic cardiomyopathy I will discontinue his enalapril and I will start him on Entresto 2426 twice daily.  Samples will be given he was clearly given instruction to stop enalapril for 48 hours before starting Entresto.  We will check his Chem-7 in about 1 week. 2. All myocardial infarction.  Stable. 3. Coronary artery disease, stress test reviewed from last year show small area of peri-infarct ischemia however he is completely asymptomatic denies of any chest pain tightness squeezing pressure burning chest can walk climb stairs no difficulties.  Therefore, we will continue present management. 4. Dyslipidemia, I did review his K PN from November of last year which show LDL of 79 and HDL 42.  This is on simvastatin 40 which I will continue. 5. ICD present this is a Medtronic device single-chamber, I did review interrogation that was done in March 16, we have 6.7 years left in the device, OptiVol stable normal, no therapy delivered since the  beginning.   Medication Adjustments/Labs and Tests Ordered: Current medicines are reviewed at length with the patient today.  Concerns regarding medicines are outlined above.  No orders of the defined types were placed in this encounter.  Medication changes: No orders of the defined types were placed in this encounter.   Signed, Park Liter, MD, James Jenkins 11/18/2020 10:33 AM    [place]

## 2020-12-10 DIAGNOSIS — D485 Neoplasm of uncertain behavior of skin: Secondary | ICD-10-CM | POA: Diagnosis not present

## 2020-12-24 DIAGNOSIS — C44629 Squamous cell carcinoma of skin of left upper limb, including shoulder: Secondary | ICD-10-CM | POA: Diagnosis not present

## 2020-12-30 DIAGNOSIS — L02414 Cutaneous abscess of left upper limb: Secondary | ICD-10-CM | POA: Diagnosis not present

## 2020-12-31 DIAGNOSIS — X58XXXA Exposure to other specified factors, initial encounter: Secondary | ICD-10-CM | POA: Diagnosis not present

## 2020-12-31 DIAGNOSIS — C61 Malignant neoplasm of prostate: Secondary | ICD-10-CM | POA: Insufficient documentation

## 2020-12-31 DIAGNOSIS — M549 Dorsalgia, unspecified: Secondary | ICD-10-CM | POA: Insufficient documentation

## 2020-12-31 DIAGNOSIS — Z79899 Other long term (current) drug therapy: Secondary | ICD-10-CM | POA: Diagnosis not present

## 2020-12-31 DIAGNOSIS — I700 Atherosclerosis of aorta: Secondary | ICD-10-CM | POA: Diagnosis not present

## 2020-12-31 DIAGNOSIS — Z9889 Other specified postprocedural states: Secondary | ICD-10-CM | POA: Diagnosis not present

## 2020-12-31 DIAGNOSIS — B951 Streptococcus, group B, as the cause of diseases classified elsewhere: Secondary | ICD-10-CM | POA: Diagnosis not present

## 2020-12-31 DIAGNOSIS — J90 Pleural effusion, not elsewhere classified: Secondary | ICD-10-CM | POA: Diagnosis not present

## 2020-12-31 DIAGNOSIS — Z85828 Personal history of other malignant neoplasm of skin: Secondary | ICD-10-CM | POA: Diagnosis not present

## 2020-12-31 DIAGNOSIS — G8929 Other chronic pain: Secondary | ICD-10-CM | POA: Insufficient documentation

## 2020-12-31 DIAGNOSIS — R202 Paresthesia of skin: Secondary | ICD-10-CM | POA: Diagnosis not present

## 2020-12-31 DIAGNOSIS — R37 Sexual dysfunction, unspecified: Secondary | ICD-10-CM | POA: Insufficient documentation

## 2020-12-31 DIAGNOSIS — S61402A Unspecified open wound of left hand, initial encounter: Secondary | ICD-10-CM | POA: Diagnosis not present

## 2020-12-31 DIAGNOSIS — M7989 Other specified soft tissue disorders: Secondary | ICD-10-CM | POA: Diagnosis not present

## 2020-12-31 DIAGNOSIS — F419 Anxiety disorder, unspecified: Secondary | ICD-10-CM | POA: Insufficient documentation

## 2020-12-31 DIAGNOSIS — N182 Chronic kidney disease, stage 2 (mild): Secondary | ICD-10-CM | POA: Diagnosis not present

## 2020-12-31 DIAGNOSIS — I712 Thoracic aortic aneurysm, without rupture, unspecified: Secondary | ICD-10-CM | POA: Insufficient documentation

## 2020-12-31 DIAGNOSIS — R600 Localized edema: Secondary | ICD-10-CM | POA: Diagnosis not present

## 2020-12-31 DIAGNOSIS — R634 Abnormal weight loss: Secondary | ICD-10-CM | POA: Diagnosis not present

## 2020-12-31 DIAGNOSIS — Z87891 Personal history of nicotine dependence: Secondary | ICD-10-CM | POA: Diagnosis not present

## 2020-12-31 DIAGNOSIS — J309 Allergic rhinitis, unspecified: Secondary | ICD-10-CM | POA: Insufficient documentation

## 2020-12-31 DIAGNOSIS — M1990 Unspecified osteoarthritis, unspecified site: Secondary | ICD-10-CM | POA: Insufficient documentation

## 2020-12-31 DIAGNOSIS — K219 Gastro-esophageal reflux disease without esophagitis: Secondary | ICD-10-CM | POA: Insufficient documentation

## 2020-12-31 DIAGNOSIS — T8149XA Infection following a procedure, other surgical site, initial encounter: Secondary | ICD-10-CM | POA: Diagnosis not present

## 2020-12-31 DIAGNOSIS — L03114 Cellulitis of left upper limb: Secondary | ICD-10-CM | POA: Diagnosis not present

## 2020-12-31 DIAGNOSIS — I130 Hypertensive heart and chronic kidney disease with heart failure and stage 1 through stage 4 chronic kidney disease, or unspecified chronic kidney disease: Secondary | ICD-10-CM | POA: Diagnosis not present

## 2020-12-31 DIAGNOSIS — Z95810 Presence of automatic (implantable) cardiac defibrillator: Secondary | ICD-10-CM | POA: Diagnosis not present

## 2020-12-31 DIAGNOSIS — L089 Local infection of the skin and subcutaneous tissue, unspecified: Secondary | ICD-10-CM | POA: Diagnosis not present

## 2020-12-31 DIAGNOSIS — Y998 Other external cause status: Secondary | ICD-10-CM | POA: Diagnosis not present

## 2020-12-31 DIAGNOSIS — I252 Old myocardial infarction: Secondary | ICD-10-CM | POA: Diagnosis not present

## 2020-12-31 DIAGNOSIS — M25642 Stiffness of left hand, not elsewhere classified: Secondary | ICD-10-CM | POA: Diagnosis not present

## 2020-12-31 DIAGNOSIS — I2510 Atherosclerotic heart disease of native coronary artery without angina pectoris: Secondary | ICD-10-CM | POA: Diagnosis not present

## 2020-12-31 DIAGNOSIS — R0602 Shortness of breath: Secondary | ICD-10-CM | POA: Diagnosis not present

## 2020-12-31 DIAGNOSIS — I5022 Chronic systolic (congestive) heart failure: Secondary | ICD-10-CM | POA: Diagnosis not present

## 2020-12-31 DIAGNOSIS — I255 Ischemic cardiomyopathy: Secondary | ICD-10-CM | POA: Diagnosis not present

## 2021-01-01 DIAGNOSIS — I255 Ischemic cardiomyopathy: Secondary | ICD-10-CM | POA: Diagnosis not present

## 2021-01-01 DIAGNOSIS — T8149XA Infection following a procedure, other surgical site, initial encounter: Secondary | ICD-10-CM | POA: Diagnosis not present

## 2021-01-01 DIAGNOSIS — S61402A Unspecified open wound of left hand, initial encounter: Secondary | ICD-10-CM | POA: Diagnosis not present

## 2021-01-01 DIAGNOSIS — Z95810 Presence of automatic (implantable) cardiac defibrillator: Secondary | ICD-10-CM | POA: Diagnosis not present

## 2021-01-01 DIAGNOSIS — D509 Iron deficiency anemia, unspecified: Secondary | ICD-10-CM | POA: Diagnosis not present

## 2021-01-01 DIAGNOSIS — M7989 Other specified soft tissue disorders: Secondary | ICD-10-CM | POA: Diagnosis not present

## 2021-01-01 DIAGNOSIS — M25642 Stiffness of left hand, not elsewhere classified: Secondary | ICD-10-CM | POA: Diagnosis not present

## 2021-01-02 ENCOUNTER — Ambulatory Visit: Payer: Medicare Other | Admitting: Cardiology

## 2021-01-02 DIAGNOSIS — T8149XA Infection following a procedure, other surgical site, initial encounter: Secondary | ICD-10-CM | POA: Diagnosis not present

## 2021-01-02 DIAGNOSIS — J90 Pleural effusion, not elsewhere classified: Secondary | ICD-10-CM | POA: Diagnosis not present

## 2021-01-02 DIAGNOSIS — J811 Chronic pulmonary edema: Secondary | ICD-10-CM | POA: Diagnosis not present

## 2021-01-02 DIAGNOSIS — Z95810 Presence of automatic (implantable) cardiac defibrillator: Secondary | ICD-10-CM | POA: Diagnosis not present

## 2021-01-02 DIAGNOSIS — R0602 Shortness of breath: Secondary | ICD-10-CM | POA: Diagnosis not present

## 2021-01-02 DIAGNOSIS — M25642 Stiffness of left hand, not elsewhere classified: Secondary | ICD-10-CM | POA: Diagnosis not present

## 2021-01-02 DIAGNOSIS — M7989 Other specified soft tissue disorders: Secondary | ICD-10-CM | POA: Diagnosis not present

## 2021-01-02 DIAGNOSIS — I255 Ischemic cardiomyopathy: Secondary | ICD-10-CM | POA: Diagnosis not present

## 2021-01-02 DIAGNOSIS — J9811 Atelectasis: Secondary | ICD-10-CM | POA: Diagnosis not present

## 2021-01-02 DIAGNOSIS — S61402A Unspecified open wound of left hand, initial encounter: Secondary | ICD-10-CM | POA: Diagnosis not present

## 2021-01-02 DIAGNOSIS — D509 Iron deficiency anemia, unspecified: Secondary | ICD-10-CM | POA: Diagnosis not present

## 2021-01-05 DIAGNOSIS — B9562 Methicillin resistant Staphylococcus aureus infection as the cause of diseases classified elsewhere: Secondary | ICD-10-CM | POA: Diagnosis not present

## 2021-01-05 DIAGNOSIS — M7989 Other specified soft tissue disorders: Secondary | ICD-10-CM | POA: Diagnosis not present

## 2021-01-05 DIAGNOSIS — Z9889 Other specified postprocedural states: Secondary | ICD-10-CM | POA: Diagnosis not present

## 2021-01-05 DIAGNOSIS — Z955 Presence of coronary angioplasty implant and graft: Secondary | ICD-10-CM | POA: Diagnosis not present

## 2021-01-05 DIAGNOSIS — M19042 Primary osteoarthritis, left hand: Secondary | ICD-10-CM | POA: Diagnosis not present

## 2021-01-05 DIAGNOSIS — Z85828 Personal history of other malignant neoplasm of skin: Secondary | ICD-10-CM | POA: Diagnosis not present

## 2021-01-05 DIAGNOSIS — E785 Hyperlipidemia, unspecified: Secondary | ICD-10-CM | POA: Diagnosis not present

## 2021-01-05 DIAGNOSIS — I255 Ischemic cardiomyopathy: Secondary | ICD-10-CM | POA: Diagnosis not present

## 2021-01-05 DIAGNOSIS — I252 Old myocardial infarction: Secondary | ICD-10-CM | POA: Diagnosis not present

## 2021-01-05 DIAGNOSIS — I2510 Atherosclerotic heart disease of native coronary artery without angina pectoris: Secondary | ICD-10-CM | POA: Diagnosis not present

## 2021-01-05 DIAGNOSIS — Z95810 Presence of automatic (implantable) cardiac defibrillator: Secondary | ICD-10-CM | POA: Diagnosis not present

## 2021-01-05 DIAGNOSIS — Z7409 Other reduced mobility: Secondary | ICD-10-CM | POA: Diagnosis not present

## 2021-01-05 DIAGNOSIS — M79642 Pain in left hand: Secondary | ICD-10-CM | POA: Diagnosis not present

## 2021-01-05 DIAGNOSIS — L03012 Cellulitis of left finger: Secondary | ICD-10-CM | POA: Diagnosis not present

## 2021-01-05 DIAGNOSIS — L03114 Cellulitis of left upper limb: Secondary | ICD-10-CM | POA: Diagnosis not present

## 2021-01-06 DIAGNOSIS — L03114 Cellulitis of left upper limb: Secondary | ICD-10-CM | POA: Diagnosis not present

## 2021-01-06 DIAGNOSIS — R600 Localized edema: Secondary | ICD-10-CM | POA: Diagnosis not present

## 2021-01-06 DIAGNOSIS — M19042 Primary osteoarthritis, left hand: Secondary | ICD-10-CM | POA: Diagnosis not present

## 2021-01-06 DIAGNOSIS — Z95810 Presence of automatic (implantable) cardiac defibrillator: Secondary | ICD-10-CM | POA: Diagnosis not present

## 2021-01-06 DIAGNOSIS — I255 Ischemic cardiomyopathy: Secondary | ICD-10-CM | POA: Diagnosis not present

## 2021-01-06 DIAGNOSIS — M19032 Primary osteoarthritis, left wrist: Secondary | ICD-10-CM | POA: Diagnosis not present

## 2021-01-06 DIAGNOSIS — I2510 Atherosclerotic heart disease of native coronary artery without angina pectoris: Secondary | ICD-10-CM | POA: Diagnosis not present

## 2021-01-06 DIAGNOSIS — E785 Hyperlipidemia, unspecified: Secondary | ICD-10-CM | POA: Diagnosis not present

## 2021-01-07 DIAGNOSIS — E785 Hyperlipidemia, unspecified: Secondary | ICD-10-CM | POA: Diagnosis not present

## 2021-01-07 DIAGNOSIS — B9562 Methicillin resistant Staphylococcus aureus infection as the cause of diseases classified elsewhere: Secondary | ICD-10-CM | POA: Diagnosis not present

## 2021-01-07 DIAGNOSIS — Z95810 Presence of automatic (implantable) cardiac defibrillator: Secondary | ICD-10-CM | POA: Diagnosis not present

## 2021-01-07 DIAGNOSIS — I2510 Atherosclerotic heart disease of native coronary artery without angina pectoris: Secondary | ICD-10-CM | POA: Diagnosis not present

## 2021-01-07 DIAGNOSIS — T8149XA Infection following a procedure, other surgical site, initial encounter: Secondary | ICD-10-CM | POA: Diagnosis not present

## 2021-01-07 DIAGNOSIS — I252 Old myocardial infarction: Secondary | ICD-10-CM | POA: Diagnosis not present

## 2021-01-07 DIAGNOSIS — I255 Ischemic cardiomyopathy: Secondary | ICD-10-CM | POA: Diagnosis not present

## 2021-01-08 DIAGNOSIS — B9562 Methicillin resistant Staphylococcus aureus infection as the cause of diseases classified elsewhere: Secondary | ICD-10-CM | POA: Diagnosis not present

## 2021-01-08 DIAGNOSIS — I252 Old myocardial infarction: Secondary | ICD-10-CM | POA: Diagnosis not present

## 2021-01-08 DIAGNOSIS — T8149XA Infection following a procedure, other surgical site, initial encounter: Secondary | ICD-10-CM | POA: Diagnosis not present

## 2021-01-08 DIAGNOSIS — E785 Hyperlipidemia, unspecified: Secondary | ICD-10-CM | POA: Diagnosis not present

## 2021-01-08 DIAGNOSIS — Z95810 Presence of automatic (implantable) cardiac defibrillator: Secondary | ICD-10-CM | POA: Diagnosis not present

## 2021-01-08 DIAGNOSIS — I255 Ischemic cardiomyopathy: Secondary | ICD-10-CM | POA: Diagnosis not present

## 2021-01-08 DIAGNOSIS — I2510 Atherosclerotic heart disease of native coronary artery without angina pectoris: Secondary | ICD-10-CM | POA: Diagnosis not present

## 2021-01-09 DIAGNOSIS — T8149XA Infection following a procedure, other surgical site, initial encounter: Secondary | ICD-10-CM | POA: Diagnosis not present

## 2021-01-14 ENCOUNTER — Ambulatory Visit (INDEPENDENT_AMBULATORY_CARE_PROVIDER_SITE_OTHER): Payer: Medicare Other

## 2021-01-14 DIAGNOSIS — I255 Ischemic cardiomyopathy: Secondary | ICD-10-CM | POA: Diagnosis not present

## 2021-01-15 LAB — CUP PACEART REMOTE DEVICE CHECK
Battery Remaining Longevity: 77 mo
Battery Voltage: 2.99 V
Brady Statistic RV Percent Paced: 0.01 %
Date Time Interrogation Session: 20220615091707
HighPow Impedance: 47 Ohm
HighPow Impedance: 63 Ohm
Implantable Lead Implant Date: 20071009
Implantable Lead Location: 753860
Implantable Lead Model: 6949
Implantable Pulse Generator Implant Date: 20160831
Lead Channel Impedance Value: 285 Ohm
Lead Channel Impedance Value: 437 Ohm
Lead Channel Pacing Threshold Amplitude: 1 V
Lead Channel Pacing Threshold Pulse Width: 0.4 ms
Lead Channel Sensing Intrinsic Amplitude: 5.25 mV
Lead Channel Sensing Intrinsic Amplitude: 5.25 mV
Lead Channel Setting Pacing Amplitude: 2 V
Lead Channel Setting Pacing Pulse Width: 0.4 ms
Lead Channel Setting Sensing Sensitivity: 0.3 mV

## 2021-01-16 ENCOUNTER — Telehealth: Payer: Self-pay | Admitting: Emergency Medicine

## 2021-01-16 NOTE — Telephone Encounter (Signed)
Voicemail full unable to accept any further messages at this time. Will re-attempt to contact patient.

## 2021-01-16 NOTE — Telephone Encounter (Addendum)
Contacted patient to assess for [facility] Alert received for elevated OptiVol. Spoke with patient patient non-symptomatic today. Patient states that he has had some edema but no more than usual due to his cancer. Patient denies any shortness of breath or any chest pain. Patient compliant with medications. Routing to Dr. Curt Bears. Just FYI.

## 2021-01-19 NOTE — Telephone Encounter (Signed)
Pt advised to increase Lasix x 3 days as instructed by Dr. Curt Bears. Patient verbalized understanding and agreeable to plan.

## 2021-01-27 DIAGNOSIS — Z139 Encounter for screening, unspecified: Secondary | ICD-10-CM | POA: Diagnosis not present

## 2021-01-27 DIAGNOSIS — Z79899 Other long term (current) drug therapy: Secondary | ICD-10-CM | POA: Diagnosis not present

## 2021-01-27 DIAGNOSIS — Z9181 History of falling: Secondary | ICD-10-CM | POA: Diagnosis not present

## 2021-01-27 DIAGNOSIS — L0390 Cellulitis, unspecified: Secondary | ICD-10-CM | POA: Diagnosis not present

## 2021-01-27 DIAGNOSIS — Z09 Encounter for follow-up examination after completed treatment for conditions other than malignant neoplasm: Secondary | ICD-10-CM | POA: Diagnosis not present

## 2021-01-27 DIAGNOSIS — E559 Vitamin D deficiency, unspecified: Secondary | ICD-10-CM | POA: Diagnosis not present

## 2021-02-05 ENCOUNTER — Other Ambulatory Visit: Payer: Self-pay | Admitting: Cardiology

## 2021-02-05 NOTE — Progress Notes (Signed)
Remote ICD transmission.   

## 2021-02-18 DIAGNOSIS — Z9181 History of falling: Secondary | ICD-10-CM | POA: Diagnosis not present

## 2021-02-18 DIAGNOSIS — E785 Hyperlipidemia, unspecified: Secondary | ICD-10-CM | POA: Diagnosis not present

## 2021-02-18 DIAGNOSIS — Z0000 Encounter for general adult medical examination without abnormal findings: Secondary | ICD-10-CM | POA: Diagnosis not present

## 2021-02-24 DIAGNOSIS — R600 Localized edema: Secondary | ICD-10-CM | POA: Diagnosis not present

## 2021-02-24 DIAGNOSIS — K219 Gastro-esophageal reflux disease without esophagitis: Secondary | ICD-10-CM | POA: Diagnosis not present

## 2021-02-24 DIAGNOSIS — R252 Cramp and spasm: Secondary | ICD-10-CM | POA: Diagnosis not present

## 2021-02-24 DIAGNOSIS — M1990 Unspecified osteoarthritis, unspecified site: Secondary | ICD-10-CM | POA: Diagnosis not present

## 2021-02-24 DIAGNOSIS — L819 Disorder of pigmentation, unspecified: Secondary | ICD-10-CM | POA: Diagnosis not present

## 2021-02-24 DIAGNOSIS — I10 Essential (primary) hypertension: Secondary | ICD-10-CM | POA: Diagnosis not present

## 2021-03-02 DIAGNOSIS — R7989 Other specified abnormal findings of blood chemistry: Secondary | ICD-10-CM | POA: Diagnosis not present

## 2021-03-16 DIAGNOSIS — R7989 Other specified abnormal findings of blood chemistry: Secondary | ICD-10-CM | POA: Diagnosis not present

## 2021-03-24 ENCOUNTER — Other Ambulatory Visit: Payer: Self-pay | Admitting: Cardiology

## 2021-03-24 ENCOUNTER — Telehealth: Payer: Self-pay | Admitting: Cardiology

## 2021-03-24 MED ORDER — ENTRESTO 24-26 MG PO TABS: 1.0000 | ORAL_TABLET | Freq: Two times a day (BID) | ORAL | 2 refills | Status: AC

## 2021-03-24 NOTE — Telephone Encounter (Signed)
Patient needs Entresto [id number]  TAB refill sent to [facility] in [place]

## 2021-03-30 DIAGNOSIS — R7989 Other specified abnormal findings of blood chemistry: Secondary | ICD-10-CM | POA: Diagnosis not present

## 2021-04-13 DIAGNOSIS — R7989 Other specified abnormal findings of blood chemistry: Secondary | ICD-10-CM | POA: Diagnosis not present

## 2021-04-15 ENCOUNTER — Telehealth: Payer: Self-pay

## 2021-04-15 ENCOUNTER — Ambulatory Visit (INDEPENDENT_AMBULATORY_CARE_PROVIDER_SITE_OTHER): Payer: Medicare Other

## 2021-04-15 DIAGNOSIS — I255 Ischemic cardiomyopathy: Secondary | ICD-10-CM

## 2021-04-15 NOTE — Telephone Encounter (Signed)
"  Scheduled remote reviewed. Normal device function. Optivol crossed threshold 7/6 and is ongoing, route to triage"

## 2021-04-16 LAB — CUP PACEART REMOTE DEVICE CHECK
Battery Remaining Longevity: 72 mo
Battery Voltage: 3 V
Brady Statistic RV Percent Paced: 0.03 %
Date Time Interrogation Session: 20220914102511
HighPow Impedance: 49 Ohm
HighPow Impedance: 70 Ohm
Implantable Lead Implant Date: 20071009
Implantable Lead Location: 753860
Implantable Lead Model: 6949
Implantable Pulse Generator Implant Date: 20160831
Lead Channel Impedance Value: 323 Ohm
Lead Channel Impedance Value: 437 Ohm
Lead Channel Pacing Threshold Amplitude: 1 V
Lead Channel Pacing Threshold Pulse Width: 0.4 ms
Lead Channel Sensing Intrinsic Amplitude: 7.5 mV
Lead Channel Sensing Intrinsic Amplitude: 7.5 mV
Lead Channel Setting Pacing Amplitude: 2 V
Lead Channel Setting Pacing Pulse Width: 0.4 ms
Lead Channel Setting Sensing Sensitivity: 0.3 mV

## 2021-04-17 NOTE — Telephone Encounter (Signed)
Successful telephone encounter to patient to follow up on elevated optivol level and s/s of fluid overload. Patient states he has "given up" soda and has been drinking a lot of water, pineapple juice, and tomato juice. Discussed role of increased sodium intake (in high sodium foods such as tomato juice) and effects on fluid retention. Patient denies increased shortness of breath, chest pain/pressure however does admit to increased lower extremity edema and mild dizziness two days ago that has resolved with an additional dose of lasix. Denies complaints today. Reviewed CHF action plan including importance of daily weights and when to notify provider. Medications also reviewed and patient acknowledges compliance however does state son and daughter-in-law fix medication box on a weekly basis. Attempted to contact son to confirm placement of lasix, coreg, an entresto into pill box per request of patient. No answer. Patient states he will decrease his sodium intake and monitor for additional s/s of volume overload. Of note his OptiVol is trending down towards baseline. Will continue to monitor closely. Patient appreciative of follow up.

## 2021-04-22 NOTE — Progress Notes (Signed)
Remote ICD transmission.   

## 2021-05-11 DIAGNOSIS — R7989 Other specified abnormal findings of blood chemistry: Secondary | ICD-10-CM | POA: Diagnosis not present

## 2021-05-12 DIAGNOSIS — L989 Disorder of the skin and subcutaneous tissue, unspecified: Secondary | ICD-10-CM | POA: Diagnosis not present

## 2021-06-04 ENCOUNTER — Telehealth: Payer: Self-pay | Admitting: Cardiology

## 2021-06-04 NOTE — Telephone Encounter (Signed)
PT came in today to see if there was anything he could switch for [facility]. Pt is moving to [place] and isn't sure he can get it there, and if he could it would be too expensive.  Best number [phone]  Thanks!

## 2021-06-04 NOTE — Telephone Encounter (Signed)
Tried to call patient back. No answer and voicemail full.

## 2021-06-05 NOTE — Telephone Encounter (Signed)
Tried to call patient no answer and voicemail full.

## 2021-06-08 NOTE — Telephone Encounter (Signed)
Tried to call patient back no answer and voicemail is full.

## 2021-06-08 NOTE — Telephone Encounter (Signed)
Spoke to patient he is moving to [place]. He wants to know if entresto is available there and if the patient assistance program is available there. Will check with rep.

## 2021-06-09 NOTE — Telephone Encounter (Signed)
Tried to call patient back no answer and voicemail not set up.

## 2021-06-11 NOTE — Telephone Encounter (Signed)
Spoke to patient. He reports he is going to try to leave in December this year for [place]. Checking with entresto rep and patient assistance if they can help him when he moves.

## 2021-06-11 NOTE — Telephone Encounter (Signed)
Talked further with the patient he is going to pick up a patient assistance form from the [place] office for entresto and try to get it set up for him to get it here first since he is not sure when/if he is moving. He will let us know if he needs anything else.

## 2021-07-02 DIAGNOSIS — I10 Essential (primary) hypertension: Secondary | ICD-10-CM | POA: Diagnosis not present

## 2021-07-02 DIAGNOSIS — R600 Localized edema: Secondary | ICD-10-CM | POA: Diagnosis not present

## 2021-07-02 DIAGNOSIS — Z79899 Other long term (current) drug therapy: Secondary | ICD-10-CM | POA: Diagnosis not present

## 2021-07-02 DIAGNOSIS — K219 Gastro-esophageal reflux disease without esophagitis: Secondary | ICD-10-CM | POA: Diagnosis not present

## 2021-07-02 DIAGNOSIS — M1990 Unspecified osteoarthritis, unspecified site: Secondary | ICD-10-CM | POA: Diagnosis not present

## 2021-07-02 DIAGNOSIS — R252 Cramp and spasm: Secondary | ICD-10-CM | POA: Diagnosis not present

## 2021-07-02 DIAGNOSIS — E785 Hyperlipidemia, unspecified: Secondary | ICD-10-CM | POA: Diagnosis not present

## 2021-07-02 DIAGNOSIS — E559 Vitamin D deficiency, unspecified: Secondary | ICD-10-CM | POA: Diagnosis not present

## 2021-07-02 DIAGNOSIS — R21 Rash and other nonspecific skin eruption: Secondary | ICD-10-CM | POA: Diagnosis not present

## 2021-07-02 DIAGNOSIS — I2510 Atherosclerotic heart disease of native coronary artery without angina pectoris: Secondary | ICD-10-CM | POA: Diagnosis not present

## 2021-07-15 ENCOUNTER — Ambulatory Visit (INDEPENDENT_AMBULATORY_CARE_PROVIDER_SITE_OTHER): Payer: Medicare Other

## 2021-07-15 DIAGNOSIS — I255 Ischemic cardiomyopathy: Secondary | ICD-10-CM

## 2021-07-16 LAB — CUP PACEART REMOTE DEVICE CHECK
Battery Remaining Longevity: 67 mo
Battery Voltage: 3 V
Brady Statistic RV Percent Paced: 0.01 %
Date Time Interrogation Session: 20221214202826
HighPow Impedance: 49 Ohm
HighPow Impedance: 68 Ohm
Implantable Lead Implant Date: 20071009
Implantable Lead Location: 753860
Implantable Lead Model: 6949
Implantable Pulse Generator Implant Date: 20160831
Lead Channel Impedance Value: 323 Ohm
Lead Channel Impedance Value: 399 Ohm
Lead Channel Pacing Threshold Amplitude: 1 V
Lead Channel Pacing Threshold Pulse Width: 0.4 ms
Lead Channel Sensing Intrinsic Amplitude: 6 mV
Lead Channel Sensing Intrinsic Amplitude: 6 mV
Lead Channel Setting Pacing Amplitude: 2 V
Lead Channel Setting Pacing Pulse Width: 0.4 ms
Lead Channel Setting Sensing Sensitivity: 0.3 mV

## 2021-07-24 NOTE — Progress Notes (Signed)
Remote ICD transmission.   

## 2022-11-09 ENCOUNTER — Telehealth: Payer: Self-pay

## 2022-11-09 NOTE — Patient Outreach (Signed)
  Care Coordination   11/09/2022 Name: James Jenkins MRN: [id number] DOB: January 22, 1946   Care Coordination Outreach Attempts:  An unsuccessful telephone outreach was attempted today to offer the patient information about available care coordination services as a benefit of their health plan.   Follow Up Plan:  Additional outreach attempts will be made to offer the patient care coordination information and services.   Encounter Outcome:  No Answer   Care Coordination Interventions:  No, not indicated    Rowe Pavy, RN, BSN, [facility] [employer] [phone]

## 2022-11-18 ENCOUNTER — Telehealth: Payer: Self-pay

## 2022-11-18 NOTE — Patient Outreach (Signed)
  Care Coordination   11/18/2022 Name: James Jenkins MRN: [id number] DOB: Jan 14, 1946   Care Coordination Outreach Attempts:  A second unsuccessful outreach was attempted today to offer the patient with information about available care coordination services as a benefit of their health plan.     Follow Up Plan:  Additional outreach attempts will be made to offer the patient care coordination information and services.   Encounter Outcome:  No Answer   Care Coordination Interventions:  No, not indicated    Rowe Pavy, RN, BSN, [facility] [employer] [phone]

## 2022-11-19 ENCOUNTER — Telehealth: Payer: Self-pay

## 2022-11-19 NOTE — Patient Outreach (Signed)
  Care Coordination   11/19/2022 Name: James Jenkins MRN: [id number] DOB: February 15, 1946   Care Coordination Outreach Attempts:  A third unsuccessful outreach was attempted today to offer the patient with information about available care coordination services as a benefit of their health plan.   Follow Up Plan:  No further outreach attempts will be made at this time. We have been unable to contact the patient to offer or enroll patient in care coordination services  Encounter Outcome:  No Answer   Care Coordination Interventions:  No, not indicated    Rowe Pavy, RN, BSN, CEN [facility] Coordinator [phone]

## 2024-04-02 DEATH — deceased
